# Patient Record
Sex: Female | Born: 1940 | Race: Black or African American | Hispanic: No | Marital: Married | Smoking: Former smoker
Health system: Southern US, Community
[De-identification: ages and names within clinical notes are randomized; demographics above are authoritative.]

## PROBLEM LIST (undated history)

## (undated) DIAGNOSIS — R42 Dizziness and giddiness: Secondary | ICD-10-CM

## (undated) DIAGNOSIS — H8109 Meniere's disease, unspecified ear: Secondary | ICD-10-CM

## (undated) HISTORY — PX: TONSILLECTOMY: SUR1361

## (undated) HISTORY — PX: CHOLECYSTECTOMY: SHX55

## (undated) HISTORY — PX: FRACTURE SURGERY: SHX138

---

## 1998-02-19 ENCOUNTER — Ambulatory Visit (HOSPITAL_COMMUNITY): Admission: RE | Admit: 1998-02-19 | Discharge: 1998-02-19 | Payer: Self-pay | Admitting: Obstetrics and Gynecology

## 1998-05-03 ENCOUNTER — Encounter: Payer: Self-pay | Admitting: Emergency Medicine

## 1998-05-03 ENCOUNTER — Emergency Department (HOSPITAL_COMMUNITY): Admission: EM | Admit: 1998-05-03 | Discharge: 1998-05-03 | Payer: Self-pay | Admitting: Emergency Medicine

## 1999-02-19 ENCOUNTER — Other Ambulatory Visit: Admission: RE | Admit: 1999-02-19 | Discharge: 1999-02-19 | Payer: Self-pay | Admitting: Obstetrics and Gynecology

## 1999-03-10 ENCOUNTER — Encounter (INDEPENDENT_AMBULATORY_CARE_PROVIDER_SITE_OTHER): Payer: Self-pay | Admitting: Specialist

## 1999-03-10 ENCOUNTER — Other Ambulatory Visit: Admission: RE | Admit: 1999-03-10 | Discharge: 1999-03-10 | Payer: Self-pay | Admitting: Obstetrics and Gynecology

## 2001-02-25 ENCOUNTER — Other Ambulatory Visit: Admission: RE | Admit: 2001-02-25 | Discharge: 2001-02-25 | Payer: Self-pay | Admitting: Obstetrics and Gynecology

## 2001-03-17 ENCOUNTER — Encounter: Payer: Self-pay | Admitting: Obstetrics and Gynecology

## 2001-03-17 ENCOUNTER — Encounter: Admission: RE | Admit: 2001-03-17 | Discharge: 2001-03-17 | Payer: Self-pay | Admitting: Obstetrics and Gynecology

## 2001-12-08 ENCOUNTER — Encounter: Admission: RE | Admit: 2001-12-08 | Discharge: 2001-12-08 | Payer: Self-pay | Admitting: Gastroenterology

## 2001-12-08 ENCOUNTER — Encounter: Payer: Self-pay | Admitting: Gastroenterology

## 2001-12-16 ENCOUNTER — Encounter: Admission: RE | Admit: 2001-12-16 | Discharge: 2001-12-16 | Payer: Self-pay | Admitting: Gastroenterology

## 2001-12-16 ENCOUNTER — Encounter: Payer: Self-pay | Admitting: Gastroenterology

## 2003-08-08 ENCOUNTER — Encounter: Admission: RE | Admit: 2003-08-08 | Discharge: 2003-08-08 | Payer: Self-pay | Admitting: Gastroenterology

## 2004-01-03 ENCOUNTER — Other Ambulatory Visit: Admission: RE | Admit: 2004-01-03 | Discharge: 2004-01-03 | Payer: Self-pay | Admitting: Gastroenterology

## 2004-08-27 ENCOUNTER — Encounter: Admission: RE | Admit: 2004-08-27 | Discharge: 2004-08-27 | Payer: Self-pay | Admitting: Gastroenterology

## 2005-01-07 ENCOUNTER — Other Ambulatory Visit: Admission: RE | Admit: 2005-01-07 | Discharge: 2005-01-07 | Payer: Self-pay | Admitting: Gastroenterology

## 2005-08-03 ENCOUNTER — Ambulatory Visit: Admission: RE | Admit: 2005-08-03 | Discharge: 2005-08-03 | Payer: Self-pay | Admitting: *Deleted

## 2006-01-18 ENCOUNTER — Other Ambulatory Visit: Admission: RE | Admit: 2006-01-18 | Discharge: 2006-01-18 | Payer: Self-pay | Admitting: Gastroenterology

## 2006-02-09 ENCOUNTER — Encounter: Admission: RE | Admit: 2006-02-09 | Discharge: 2006-02-09 | Payer: Self-pay | Admitting: Gastroenterology

## 2006-04-28 ENCOUNTER — Encounter: Admission: RE | Admit: 2006-04-28 | Discharge: 2006-04-28 | Payer: Self-pay | Admitting: Gastroenterology

## 2006-05-21 ENCOUNTER — Encounter (INDEPENDENT_AMBULATORY_CARE_PROVIDER_SITE_OTHER): Payer: Self-pay | Admitting: Specialist

## 2006-05-21 ENCOUNTER — Ambulatory Visit (HOSPITAL_COMMUNITY): Admission: RE | Admit: 2006-05-21 | Discharge: 2006-05-21 | Payer: Self-pay | Admitting: General Surgery

## 2007-01-27 ENCOUNTER — Other Ambulatory Visit: Admission: RE | Admit: 2007-01-27 | Discharge: 2007-01-27 | Payer: Self-pay | Admitting: Gastroenterology

## 2007-02-21 ENCOUNTER — Encounter: Admission: RE | Admit: 2007-02-21 | Discharge: 2007-02-21 | Payer: Self-pay | Admitting: Gastroenterology

## 2007-10-14 ENCOUNTER — Encounter: Admission: RE | Admit: 2007-10-14 | Discharge: 2007-10-14 | Payer: Self-pay | Admitting: Gastroenterology

## 2009-02-12 ENCOUNTER — Encounter: Admission: RE | Admit: 2009-02-12 | Discharge: 2009-02-12 | Payer: Self-pay | Admitting: Internal Medicine

## 2009-02-14 ENCOUNTER — Encounter: Admission: RE | Admit: 2009-02-14 | Discharge: 2009-02-14 | Payer: Self-pay | Admitting: Internal Medicine

## 2009-08-05 ENCOUNTER — Encounter: Admission: RE | Admit: 2009-08-05 | Discharge: 2009-08-05 | Payer: Self-pay | Admitting: Internal Medicine

## 2010-02-11 ENCOUNTER — Encounter: Admission: RE | Admit: 2010-02-11 | Discharge: 2010-02-11 | Payer: Self-pay | Admitting: Internal Medicine

## 2010-11-14 NOTE — Op Note (Signed)
NAME:  Martha Lara, WIETING NO.:  [id number]   MEDICAL RECORD NO.:  [id number]          PATIENT TYPE:  AMB   LOCATION:  DAY                          FACILITY:  [facility]   PHYSICIAN:  Leonie Man, M.D.   DATE OF BIRTH:  February 24, 1941   DATE OF PROCEDURE:  DATE OF DISCHARGE:                               OPERATIVE REPORT   PREOPERATIVE DIAGNOSIS:  Chronic calculous; cholecystitis.   POSTOPERATIVE DIAGNOSIS:  Chronic calculous; cholecystitis.   PROCEDURE:  Laparoscopic cholecystectomy with intraoperative  cholangiogram.   SURGEON:  Leonie Man, M.D.   ASSISTANT:  Dr. Jerelene Redden.   ANESTHESIA:  General.   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  This has been associated with intermittent nausea but  without vomiting.  She underwent ultrasound evaluation which shows  cholelithiasis.  Her liver functions are within normal limits.  She  comes to the operating room now after the risks and potential benefits  of surgery have been discussed, all questions answered and consent  obtained.Marland Kitchen   PROCEDURE:  The patient was positioned supinely and the abdomen was  prepped and draped to be included in a sterile operative field after  adequate general anesthesia.  Open laparoscopy was created at the  umbilicus with insertion of a Hassan cannula and insufflation peritoneal  cavity to 14 mmHg pressure.  Exploration of the abdomen showed a normal  anterior gastric wall.  Duodenal sweep, the liver edges were sharp,  liver surface smooth.  The gallbladder appeared to be chronically  scarred but without any significant number of adhesions to it.  The  remainder of the abdomen, small and large intestine visualized, did not  appear to be abnormal.   Under direct vision, epigastric and lateral ports were placed.  The  gallbladder was grasped and retracted  cephalad.  Dissection carried down  to the region of the hepatic duodenal ligament and ampulla of the  gallbladder isolating the cystic artery and the cystic duct.  The cystic  artery was doubly clipped and transected.  The cystic duct was clipped  proximally and opened.  The cystic duct cholangiogram was carried out by  passing a Cook catheter into the abdomen and into the cystic duct.  Through this a one half-strength Hypaque solution was injected under  fluoroscopic control and the resultant cholangiogram shows prompt flow  of contrast into the duodenum.  There are no filling defects in the  extrahepatic ductal system.  Cholangiocatheter was withdrawn and the  cystic duct was triply clipped and transected.  The gallbladder was then  dissected free from the liver bed using electrocautery and maintaining  hemostasis throughout the entire course of the dissection.  At end of  the dissection, the bed was inspected.  There were no bleeding points.  The right upper quadrant was thoroughly irrigated with normal saline and  aspirated.  The gallbladder was retrieved through the umbilical port  without difficulty.  Sponge and instrument counts were verified, trocars  removed  under direct vision and the pneumoperitoneum allowed to deflate.  The wounds closed in layers, #0 Dexon and 4-0 Monocryl in the umbilicus,  4-0 Monocryl in the epigastric and flank  wounds.  The wounds were then reinforced with Steri-Strips.  Sterile  dressings applied.  The anesthetic reversed.  The patient removed from  the operating room to the recovery room in stable condition.  She  tolerated the procedure well.      Leonie Man, M.D.  Electronically Signed     PB/MEDQ  D:  05/21/2006  T:  05/21/2006  Job:  [id number]   cc:   Tasia Catchings, M.D.  Fax: [phone]

## 2011-01-27 ENCOUNTER — Other Ambulatory Visit: Payer: Self-pay | Admitting: Internal Medicine

## 2011-01-27 DIAGNOSIS — Z1231 Encounter for screening mammogram for malignant neoplasm of breast: Secondary | ICD-10-CM

## 2011-02-13 ENCOUNTER — Ambulatory Visit
Admission: RE | Admit: 2011-02-13 | Discharge: 2011-02-13 | Disposition: A | Discharge status: Home / Self Care | Payer: BC Managed Care – PPO | Source: Ambulatory Visit | Attending: Internal Medicine | Admitting: Internal Medicine

## 2011-02-13 DIAGNOSIS — Z1231 Encounter for screening mammogram for malignant neoplasm of breast: Secondary | ICD-10-CM

## 2012-02-22 ENCOUNTER — Other Ambulatory Visit: Payer: Self-pay | Admitting: Internal Medicine

## 2012-02-22 DIAGNOSIS — Z1231 Encounter for screening mammogram for malignant neoplasm of breast: Secondary | ICD-10-CM

## 2012-03-18 ENCOUNTER — Ambulatory Visit: Payer: BC Managed Care – PPO

## 2012-05-24 ENCOUNTER — Other Ambulatory Visit: Payer: Self-pay | Admitting: Gastroenterology

## 2012-06-13 ENCOUNTER — Ambulatory Visit
Admission: RE | Admit: 2012-06-13 | Discharge: 2012-06-13 | Disposition: A | Discharge status: Home / Self Care | Payer: BC Managed Care – PPO | Source: Ambulatory Visit | Attending: Internal Medicine | Admitting: Internal Medicine

## 2012-06-13 DIAGNOSIS — Z1231 Encounter for screening mammogram for malignant neoplasm of breast: Secondary | ICD-10-CM

## 2012-06-17 ENCOUNTER — Other Ambulatory Visit: Payer: Self-pay | Admitting: Internal Medicine

## 2012-06-17 DIAGNOSIS — R928 Other abnormal and inconclusive findings on diagnostic imaging of breast: Secondary | ICD-10-CM

## 2012-07-01 ENCOUNTER — Ambulatory Visit
Admission: RE | Admit: 2012-07-01 | Discharge: 2012-07-01 | Disposition: A | Discharge status: Home / Self Care | Payer: Medicare PPO | Source: Ambulatory Visit | Attending: Internal Medicine | Admitting: Internal Medicine

## 2012-07-01 DIAGNOSIS — R928 Other abnormal and inconclusive findings on diagnostic imaging of breast: Secondary | ICD-10-CM

## 2012-09-08 ENCOUNTER — Ambulatory Visit (INDEPENDENT_AMBULATORY_CARE_PROVIDER_SITE_OTHER): Payer: Medicare PPO | Admitting: Family Medicine

## 2012-09-08 VITALS — BP 138/84 | HR 87 | Temp 98.1°F | Resp 16 | Ht 64.5 in | Wt 210.0 lb

## 2012-09-08 DIAGNOSIS — J309 Allergic rhinitis, unspecified: Secondary | ICD-10-CM

## 2012-09-08 DIAGNOSIS — J019 Acute sinusitis, unspecified: Secondary | ICD-10-CM

## 2012-09-08 MED ORDER — AMOXICILLIN-POT CLAVULANATE 875-125 MG PO TABS: 1.0000 | ORAL_TABLET | Freq: Two times a day (BID) | ORAL | Status: DC

## 2012-09-08 NOTE — Patient Instructions (Signed)

## 2012-09-08 NOTE — Progress Notes (Signed)
  [facility]:  Office Visit  Chief Complaint:  Chief Complaint  Patient presents with  . URI    x 2 weeks    HPI: Martha Lara is a 72 y.o. female who complains of URI sxs x 2 weeks , tried nasonex and claritin without relief , h/o allergies Facial pain. HA all last night. Barely any cough. Just a touch of ringing in ears. No fevers, chills.  Son and grandkids have had colds. + allergies. Never gets flu vaccine.   History reviewed. No pertinent past medical history. Past Surgical History  Procedure Laterality Date  . Cholecystectomy    . Tonsillectomy     History   Social History  . Marital Status: Married    Spouse Name: N/A    Number of Children: N/A  . Years of Education: N/A   Social History Main Topics  . Smoking status: Former Games developer  . Smokeless tobacco: Never Used  . Alcohol Use: No  . Drug Use: No  . Sexually Active: None   Other Topics Concern  . None   Social History Narrative  . None   Family History  Problem Relation Age of Onset  . Cancer Father    No Known Allergies Prior to Admission medications   Medication Sig Start Date End Date Taking? Authorizing Provider  loratadine (CLARITIN) 10 MG tablet Take 10 mg by mouth daily.   Yes Historical Provider, MD  mometasone (NASONEX) 50 MCG/ACT nasal spray Place 2 sprays into the nose daily.   Yes Historical Provider, MD     ROS: The patient denies night sweats, unintentional weight loss, chest pain, palpitations, wheezing, dyspnea on exertion, nausea, vomiting, abdominal pain, dysuria, hematuria, melena, numbness, weakness, or tingling.  All other systems have been reviewed and were otherwise negative with the exception of those mentioned in the HPI and as above.    PHYSICAL EXAM: Filed Vitals:   09/08/12 1139  BP: 138/84  Pulse: 87  Temp: 98.1 F (36.7 C)  Resp: 16  Spo 2  99% Filed Vitals:   09/08/12 1139  Height: 5' 4.5" (1.638 m)  Weight: 210 lb (95.255 kg)   Body  mass index is 35.5 kg/(m^2).  General: Alert, no acute distress HEENT:  Normocephalic, atraumatic, oropharynx patent.  + sinus tenerness bil. TM normal. No exudtes.  Cardiovascular:  Regular rate and rhythm, no rubs murmurs or gallops.  No Carotid bruits, radial pulse intact. No pedal edema.  Respiratory: Clear to auscultation bilaterally.  No wheezes, rales, or rhonchi.  No cyanosis, no use of accessory musculature GI: No organomegaly, abdomen is soft and non-tender, positive bowel sounds.  No masses. Skin: No rashes. Neurologic: Facial musculature symmetric. Psychiatric: Patient is appropriate throughout our interaction. Lymphatic: No cervical lymphadenopathy Musculoskeletal: Gait intact.   LABS: No results found for this or any previous visit.   EKG/XRAY:   Primary read interpreted by Dr. Conley Rolls at [facility].   ASSESSMENT/PLAN: Encounter Diagnoses  Name Primary?  . Acute sinusitis Yes  . Allergic rhinitis    Rx Augmentin C/w nasonex and claritin F/u prn     Damien Batty PHUONG, DO 09/08/2012 1:07 PM

## 2013-03-10 ENCOUNTER — Other Ambulatory Visit: Payer: Self-pay | Admitting: Orthopedic Surgery

## 2013-03-10 DIAGNOSIS — M25512 Pain in left shoulder: Secondary | ICD-10-CM

## 2013-03-21 ENCOUNTER — Ambulatory Visit
Admission: RE | Admit: 2013-03-21 | Discharge: 2013-03-21 | Disposition: A | Discharge status: Home / Self Care | Payer: Medicare PPO | Source: Ambulatory Visit | Attending: Orthopedic Surgery | Admitting: Orthopedic Surgery

## 2013-03-21 DIAGNOSIS — M25512 Pain in left shoulder: Secondary | ICD-10-CM

## 2013-03-21 MED ORDER — IOHEXOL 180 MG/ML  SOLN
15.0000 mL | Freq: Once | INTRAMUSCULAR | Status: AC | PRN
  Administered 2013-03-21: 15 mL via INTRA_ARTICULAR

## 2013-08-24 ENCOUNTER — Other Ambulatory Visit: Payer: Self-pay

## 2013-08-24 DIAGNOSIS — Z1231 Encounter for screening mammogram for malignant neoplasm of breast: Secondary | ICD-10-CM

## 2013-09-08 ENCOUNTER — Ambulatory Visit
Admission: RE | Admit: 2013-09-08 | Discharge: 2013-09-08 | Disposition: A | Discharge status: Home / Self Care | Payer: Medicare PPO | Source: Ambulatory Visit

## 2013-09-08 DIAGNOSIS — Z1231 Encounter for screening mammogram for malignant neoplasm of breast: Secondary | ICD-10-CM

## 2013-09-21 ENCOUNTER — Other Ambulatory Visit (HOSPITAL_COMMUNITY)
Admission: RE | Admit: 2013-09-21 | Discharge: 2013-09-21 | Disposition: A | Discharge status: Home / Self Care | Payer: Medicare PPO | Source: Ambulatory Visit | Attending: Obstetrics & Gynecology | Admitting: Obstetrics & Gynecology

## 2013-09-21 ENCOUNTER — Other Ambulatory Visit: Payer: Self-pay | Admitting: Obstetrics & Gynecology

## 2013-09-21 DIAGNOSIS — Z01419 Encounter for gynecological examination (general) (routine) without abnormal findings: Secondary | ICD-10-CM | POA: Insufficient documentation

## 2013-09-21 DIAGNOSIS — Z1151 Encounter for screening for human papillomavirus (HPV): Secondary | ICD-10-CM | POA: Insufficient documentation

## 2013-11-05 ENCOUNTER — Emergency Department (HOSPITAL_COMMUNITY)
Admission: EM | Admit: 2013-11-05 | Discharge: 2013-11-05 | Disposition: A | Discharge status: Home / Self Care | Payer: Medicare PPO | Attending: Emergency Medicine | Admitting: Emergency Medicine

## 2013-11-05 DIAGNOSIS — E860 Dehydration: Secondary | ICD-10-CM | POA: Insufficient documentation

## 2013-11-05 DIAGNOSIS — Z87891 Personal history of nicotine dependence: Secondary | ICD-10-CM | POA: Insufficient documentation

## 2013-11-05 DIAGNOSIS — R197 Diarrhea, unspecified: Secondary | ICD-10-CM | POA: Insufficient documentation

## 2013-11-05 DIAGNOSIS — R42 Dizziness and giddiness: Secondary | ICD-10-CM

## 2013-11-05 DIAGNOSIS — N390 Urinary tract infection, site not specified: Secondary | ICD-10-CM

## 2013-11-05 DIAGNOSIS — H9190 Unspecified hearing loss, unspecified ear: Secondary | ICD-10-CM | POA: Insufficient documentation

## 2013-11-05 DIAGNOSIS — Z8709 Personal history of other diseases of the respiratory system: Secondary | ICD-10-CM | POA: Insufficient documentation

## 2013-11-05 DIAGNOSIS — H5500 Unspecified nystagmus: Secondary | ICD-10-CM | POA: Insufficient documentation

## 2013-11-05 DIAGNOSIS — Z79899 Other long term (current) drug therapy: Secondary | ICD-10-CM | POA: Insufficient documentation

## 2013-11-05 LAB — URINALYSIS, ROUTINE W REFLEX MICROSCOPIC
Bilirubin Urine: NEGATIVE
GLUCOSE, UA: NEGATIVE mg/dL
Hgb urine dipstick: NEGATIVE
Ketones, ur: NEGATIVE mg/dL
NITRITE: NEGATIVE
Protein, ur: NEGATIVE mg/dL
Specific Gravity, Urine: 1.022 (ref 1.005–1.030)
Urobilinogen, UA: 0.2 mg/dL (ref 0.0–1.0)
pH: 5 (ref 5.0–8.0)

## 2013-11-05 LAB — CBC WITH DIFFERENTIAL/PLATELET
BASOS ABS: 0 10*3/uL (ref 0.0–0.1)
Basophils Relative: 0 % (ref 0–1)
Eosinophils Absolute: 0 10*3/uL (ref 0.0–0.7)
Eosinophils Relative: 1 % (ref 0–5)
HCT: 40.7 % (ref 36.0–46.0)
Hemoglobin: 13.6 g/dL (ref 12.0–15.0)
LYMPHS ABS: 1 10*3/uL (ref 0.7–4.0)
Lymphocytes Relative: 21 % (ref 12–46)
MCH: 31.7 pg (ref 26.0–34.0)
MCHC: 33.4 g/dL (ref 30.0–36.0)
MCV: 94.9 fL (ref 78.0–100.0)
Monocytes Absolute: 0.3 10*3/uL (ref 0.1–1.0)
Monocytes Relative: 7 % (ref 3–12)
NEUTROS ABS: 3.3 10*3/uL (ref 1.7–7.7)
Neutrophils Relative %: 71 % (ref 43–77)
Platelets: 257 10*3/uL (ref 150–400)
RBC: 4.29 MIL/uL (ref 3.87–5.11)
RDW: 12.7 % (ref 11.5–15.5)
WBC: 4.7 10*3/uL (ref 4.0–10.5)

## 2013-11-05 LAB — BASIC METABOLIC PANEL
BUN: 12 mg/dL (ref 6–23)
CHLORIDE: 104 meq/L (ref 96–112)
CO2: 23 meq/L (ref 19–32)
Calcium: 9.3 mg/dL (ref 8.4–10.5)
Creatinine, Ser: 0.84 mg/dL (ref 0.50–1.10)
GFR calc non Af Amer: 68 mL/min — ABNORMAL LOW (ref >90)
GFR, EST AFRICAN AMERICAN: 79 mL/min — AB (ref >90)
Glucose, Bld: 149 mg/dL — ABNORMAL HIGH (ref 70–99)
Potassium: 3.6 mEq/L — ABNORMAL LOW (ref 3.7–5.3)
SODIUM: 139 meq/L (ref 137–147)

## 2013-11-05 LAB — URINE MICROSCOPIC-ADD ON

## 2013-11-05 MED ORDER — DEXTROSE 5 % IV SOLN
1.0000 g | Freq: Once | INTRAVENOUS | Status: AC
  Administered 2013-11-05: 1 g via INTRAVENOUS
  Filled 2013-11-05: qty 10

## 2013-11-05 MED ORDER — LORAZEPAM 2 MG/ML IJ SOLN
0.5000 mg | Freq: Once | INTRAMUSCULAR | Status: AC
  Administered 2013-11-05: 0.5 mg via INTRAVENOUS
  Filled 2013-11-05: qty 1

## 2013-11-05 MED ORDER — SODIUM CHLORIDE 0.9 % IV BOLUS (SEPSIS)
1000.0000 mL | Freq: Once | INTRAVENOUS | Status: AC
  Administered 2013-11-05: 1000 mL via INTRAVENOUS

## 2013-11-05 MED ORDER — LORAZEPAM 1 MG PO TABS: 1.0000 mg | ORAL_TABLET | Freq: Three times a day (TID) | ORAL | Status: DC | PRN

## 2013-11-05 MED ORDER — CEPHALEXIN 500 MG PO CAPS: 500.0000 mg | ORAL_CAPSULE | Freq: Two times a day (BID) | ORAL | Status: DC

## 2013-11-05 NOTE — ED Provider Notes (Signed)
CSN: [id number]     Arrival date & time 11/05/13  1202 History   First MD Initiated Contact with Patient 11/05/13 1207     No chief complaint on file.    (Consider location/radiation/quality/duration/timing/severity/associated sxs/prior Treatment) HPI  Pt with hx vertigo, meniere's disease presents with worsening dizziness since April 15.  States the past two days she has had longer and more intense episodes than usual. She has dizziness, defined as room spinning, right ear hearing distorted.  Symptoms are exacerbated with moving eyes or head from side to side. When she has an "attack" like this she also gets lower abdominal cramping and loose stool - the abdominal discomfort resolves after she has a bowel movement.  She took meclizine this morning but was unable to keep it down.  Has also previously tried valium, prescribed by ENT, without improvement.  Denies fevers, headache or pain anywhere, diplopia, weakness or numbness of the extremities.   Pt treated recently for sinus infection with antibiotics and steroids, finished one week ago.    No past medical history on file. Past Surgical History  Procedure Laterality Date  . Cholecystectomy    . Tonsillectomy     Family History  Problem Relation Age of Onset  . Cancer Father    History  Substance Use Topics  . Smoking status: Former Research scientist (life sciences)  . Smokeless tobacco: Never Used  . Alcohol Use: No   OB History   Grav Para Term Preterm Abortions TAB SAB Ect Mult Living                 Review of Systems  Constitutional: Negative for fever.  HENT: Positive for hearing loss. Negative for ear discharge and ear pain.   Respiratory: Negative for cough and shortness of breath.   Cardiovascular: Negative for chest pain.  Gastrointestinal: Negative for nausea, vomiting, abdominal pain and diarrhea.  Genitourinary: Negative for dysuria, urgency and frequency.  Neurological: Positive for dizziness. Negative for weakness, light-headedness,  numbness and headaches.  All other systems reviewed and are negative.     Allergies  Review of patient's allergies indicates no known allergies.  Home Medications   Prior to Admission medications   Medication Sig Start Date End Date Taking? Authorizing Provider  loratadine (CLARITIN) 10 MG tablet Take 10 mg by mouth daily.   Yes Historical Provider, MD  meclizine (ANTIVERT) 12.5 MG tablet Take 12.5 mg by mouth once.   Yes Historical Provider, MD   BP 151/91  Pulse 74  Temp(Src) 97.5 F (36.4 C) (Oral)  Resp 16  SpO2 98% Physical Exam  Nursing note and vitals reviewed. Constitutional: She appears well-developed and well-nourished. No distress.  HENT:  Head: Normocephalic and atraumatic.  Mouth/Throat: Mucous membranes are dry. No posterior oropharyngeal edema or posterior oropharyngeal erythema.  Eyes: Conjunctivae and EOM are normal. Pupils are equal, round, and reactive to light. Right eye exhibits no discharge. Left eye exhibits no discharge. No scleral icterus.  Neck: Normal range of motion. Neck supple.  Cardiovascular: Normal rate and regular rhythm.   Pulmonary/Chest: Effort normal and breath sounds normal. No respiratory distress. She has no wheezes. She has no rales.  Abdominal: Soft. She exhibits no distension. There is no tenderness. There is no rebound and no guarding.  Neurological: She is alert.  CN II-XII intact, EOMs intact, no pronator drift, grip strengths equal bilaterally; strength 5/5 in all extremities, sensation intact in all extremities; finger to nose, heel to shin, rapid alternating movements normal.  Gait testing deferred  as patient is dizzy.    +horizontal nystagmus.  symptoms exacerbated with EOM testing and moving head left to right.   Skin: She is not diaphoretic.  Psychiatric: She has a normal mood and affect. Her behavior is normal.    ED Course  Procedures (including critical care time) Labs Review Labs Reviewed  URINALYSIS, ROUTINE W REFLEX  MICROSCOPIC - Abnormal; Notable for the following:    APPearance CLOUDY (*)    Leukocytes, UA SMALL (*)    All other components within normal limits  BASIC METABOLIC PANEL - Abnormal; Notable for the following:    Potassium 3.6 (*)    Glucose, Bld 149 (*)    GFR calc non Af Amer 68 (*)    GFR calc Af Amer 79 (*)    All other components within normal limits  URINE MICROSCOPIC-ADD ON - Abnormal; Notable for the following:    Squamous Epithelial / LPF FEW (*)    All other components within normal limits  URINE CULTURE  CBC WITH DIFFERENTIAL    Imaging Review No results found.   EKG Interpretation None      2:14 PM Pt reports she is feeling much better.  Dizziness has resolved.  No nystagmus presently.  She is able to move head left to right and perform EOM testing without symptoms.   Pt discussed with Dr Zenia Resides who has also seen the patient.    MDM   Final diagnoses:  Vertigo  UTI (lower urinary tract infection)  Mild dehydration    Pt with hx meniere's disease/vertigo with worsening symptoms over the past 3 weeks, uncontrolled with meclizine and valium at home.  Pt found to be dehydrated with UTI.  Vertiginous symptoms improved with IVF and ativan.  D/C home with abx for UTI, also ativan for dizziness PRN.  Discussed results, findings, treatment, and follow up  with patient.  Pt given return precautions.  Pt verbalizes understanding and agrees with plan.        Clayton Bibles, PA-C 11/05/13 1456

## 2013-11-05 NOTE — ED Provider Notes (Signed)
Medical screening examination/treatment/procedure(s) were conducted as a shared visit with non-physician practitioner(s) and myself.  I personally evaluated the patient during the encounter.   EKG Interpretation None     Patient here complaining of dizziness with emesis similar to her history of having Mnire's disease. Feels better after meds. No focal neurological deficits that she is stable for discharge.  Leota Jacobsen, MD 11/05/13 [phone]

## 2013-11-05 NOTE — ED Notes (Signed)
Bed: WA20 Expected date:  Expected time:  Means of arrival:  Comments: HOLD for triage

## 2013-11-05 NOTE — Discharge Instructions (Signed)
Read the information below.  Use the prescribed medication as directed.  Please discuss all new medications with your pharmacist.  You may return to the Emergency Department at any time for worsening condition or any new symptoms that concern you.    Your exam shows you have had an episode of vertigo, which causes a false sense of movement such as a spinning feeling or walls that seem to move.  Most vertigo is caused by a (usually temporary) problem in the inner ear. Rarely, the back part of the brain can cause vertigo (some mini-strokes / TIA's / strokes), but it appears to be a low risk cause for you at this time. It is important to follow-up with your doctor however, to see if you need further testing.  Do not drive or participate in potentially dangerous activities requiring balance unless off meds (not drowsy) and the vertigo has resolved. Most of the time benign vertigo is much better after a few days. However, mild unsteadiness may last for up to 3 months in some patients. An MRI scan or other special tests to evaluate your hearing and balance may be needed if the vertigo does not improve or returns in the future. RETURN IMMEDIATELY IF YOU HAVE ANY OF THE FOLLOWING (call 911): Increasing vertigo, earache, ear drainage, or loss of hearing.  Severe headache, blurred or double vision, or trouble walking.  Fainting or poorly responsive, extreme weakness, chest pain, or palpitations.  Fever, persistent vomiting, or dehydration.  Numbness, tingling, incoordination, or weakness of the limbs.  Change in speech, vision, swallowing, understanding, or other concerns.   Dehydration, Adult Dehydration is when you lose more fluids from the body than you take in. Vital organs like the kidneys, brain, and heart cannot function without a proper amount of fluids and salt. Any loss of fluids from the body can cause dehydration.  CAUSES   Vomiting.  Diarrhea.  Excessive sweating.  Excessive urine  output.  Fever. SYMPTOMS  Mild dehydration  Thirst.  Dry lips.  Slightly dry mouth. Moderate dehydration  Very dry mouth.  Sunken eyes.  Skin does not bounce back quickly when lightly pinched and released.  Dark urine and decreased urine production.  Decreased tear production.  Headache. Severe dehydration  Very dry mouth.  Extreme thirst.  Rapid, weak pulse (more than 100 beats per minute at rest).  Cold hands and feet.  Not able to sweat in spite of heat and temperature.  Rapid breathing.  Blue lips.  Confusion and lethargy.  Difficulty being awakened.  Minimal urine production.  No tears. DIAGNOSIS  Your caregiver will diagnose dehydration based on your symptoms and your exam. Blood and urine tests will help confirm the diagnosis. The diagnostic evaluation should also identify the cause of dehydration. TREATMENT  Treatment of mild or moderate dehydration can often be done at home by increasing the amount of fluids that you drink. It is best to drink small amounts of fluid more often. Drinking too much at one time can make vomiting worse. Refer to the home care instructions below. Severe dehydration needs to be treated at the hospital where you will probably be given intravenous (IV) fluids that contain water and electrolytes. HOME CARE INSTRUCTIONS   Ask your caregiver about specific rehydration instructions.  Drink enough fluids to keep your urine clear or pale yellow.  Drink small amounts frequently if you have nausea and vomiting.  Eat as you normally do.  Avoid:  Foods or drinks high in sugar.  Carbonated  drinks.  Juice.  Extremely hot or cold fluids.  Drinks with caffeine.  Fatty, greasy foods.  Alcohol.  Tobacco.  Overeating.  Gelatin desserts.  Wash your hands well to avoid spreading bacteria and viruses.  Only take over-the-counter or prescription medicines for pain, discomfort, or fever as directed by your  caregiver.  Ask your caregiver if you should continue all prescribed and over-the-counter medicines.  Keep all follow-up appointments with your caregiver. SEEK MEDICAL CARE IF:  You have abdominal pain and it increases or stays in one area (localizes).  You have a rash, stiff neck, or severe headache.  You are irritable, sleepy, or difficult to awaken.  You are weak, dizzy, or extremely thirsty. SEEK IMMEDIATE MEDICAL CARE IF:   You are unable to keep fluids down or you get worse despite treatment.  You have frequent episodes of vomiting or diarrhea.  You have blood or green matter (bile) in your vomit.  You have blood in your stool or your stool looks black and tarry.  You have not urinated in 6 to 8 hours, or you have only urinated a small amount of very dark urine.  You have a fever.  You faint. MAKE SURE YOU:   Understand these instructions.  Will watch your condition.  Will get help right away if you are not doing well or get worse. Document Released: 06/15/2005 Document Revised: 09/07/2011 Document Reviewed: 02/02/2011 [facility] Patient Information 2014 [place], [place].  Urinary Tract Infection Urinary tract infections (UTIs) can develop anywhere along your urinary tract. Your urinary tract is your body's drainage system for removing wastes and extra water. Your urinary tract includes two kidneys, two ureters, a bladder, and a urethra. Your kidneys are a pair of bean-shaped organs. Each kidney is about the size of your fist. They are located below your ribs, one on each side of your spine. CAUSES Infections are caused by microbes, which are microscopic organisms, including fungi, viruses, and bacteria. These organisms are so small that they can only be seen through a microscope. Bacteria are the microbes that most commonly cause UTIs. SYMPTOMS  Symptoms of UTIs may vary by age and gender of the patient and by the location of the infection. Symptoms in young women  typically include a frequent and intense urge to urinate and a painful, burning feeling in the bladder or urethra during urination. Older women and men are more likely to be tired, shaky, and weak and have muscle aches and abdominal pain. A fever may mean the infection is in your kidneys. Other symptoms of a kidney infection include pain in your back or sides below the ribs, nausea, and vomiting. DIAGNOSIS To diagnose a UTI, your caregiver will ask you about your symptoms. Your caregiver also will ask to provide a urine sample. The urine sample will be tested for bacteria and white blood cells. White blood cells are made by your body to help fight infection. TREATMENT  Typically, UTIs can be treated with medication. Because most UTIs are caused by a bacterial infection, they usually can be treated with the use of antibiotics. The choice of antibiotic and length of treatment depend on your symptoms and the type of bacteria causing your infection. HOME CARE INSTRUCTIONS  If you were prescribed antibiotics, take them exactly as your caregiver instructs you. Finish the medication even if you feel better after you have only taken some of the medication.  Drink enough water and fluids to keep your urine clear or pale yellow.  Avoid caffeine,  tea, and carbonated beverages. They tend to irritate your bladder.  Empty your bladder often. Avoid holding urine for long periods of time.  Empty your bladder before and after sexual intercourse.  After a bowel movement, women should cleanse from front to back. Use each tissue only once. SEEK MEDICAL CARE IF:   You have back pain.  You develop a fever.  Your symptoms do not begin to resolve within 3 days. SEEK IMMEDIATE MEDICAL CARE IF:   You have severe back pain or lower abdominal pain.  You develop chills.  You have nausea or vomiting.  You have continued burning or discomfort with urination. MAKE SURE YOU:   Understand these  instructions.  Will watch your condition.  Will get help right away if you are not doing well or get worse. Document Released: 03/25/2005 Document Revised: 12/15/2011 Document Reviewed: 07/24/2011 [facility] Patient Information 2014 [place].

## 2013-11-05 NOTE — ED Notes (Signed)
Pt c/o nausea, vomiting and diarrhea intermittently since April 15th, 2015

## 2013-11-06 LAB — URINE CULTURE

## 2013-11-13 NOTE — ED Provider Notes (Signed)
Medical screening examination/treatment/procedure(s) were performed by non-physician practitioner and as supervising physician I was immediately available for consultation/collaboration.  Leota Jacobsen, MD 11/13/13 [phone]

## 2013-12-05 ENCOUNTER — Emergency Department (HOSPITAL_COMMUNITY)
Admission: EM | Admit: 2013-12-05 | Discharge: 2013-12-05 | Disposition: A | Discharge status: Home / Self Care | Payer: No Typology Code available for payment source | Attending: Emergency Medicine | Admitting: Emergency Medicine

## 2013-12-05 ENCOUNTER — Emergency Department (HOSPITAL_COMMUNITY): Payer: No Typology Code available for payment source

## 2013-12-05 ENCOUNTER — Encounter (HOSPITAL_COMMUNITY): Payer: Self-pay | Admitting: Emergency Medicine

## 2013-12-05 DIAGNOSIS — S0990XA Unspecified injury of head, initial encounter: Secondary | ICD-10-CM | POA: Insufficient documentation

## 2013-12-05 DIAGNOSIS — S46909A Unspecified injury of unspecified muscle, fascia and tendon at shoulder and upper arm level, unspecified arm, initial encounter: Secondary | ICD-10-CM | POA: Diagnosis not present

## 2013-12-05 DIAGNOSIS — Z79899 Other long term (current) drug therapy: Secondary | ICD-10-CM | POA: Insufficient documentation

## 2013-12-05 DIAGNOSIS — Z792 Long term (current) use of antibiotics: Secondary | ICD-10-CM | POA: Diagnosis not present

## 2013-12-05 DIAGNOSIS — R51 Headache: Secondary | ICD-10-CM

## 2013-12-05 DIAGNOSIS — Y92410 Unspecified street and highway as the place of occurrence of the external cause: Secondary | ICD-10-CM | POA: Insufficient documentation

## 2013-12-05 DIAGNOSIS — S4980XA Other specified injuries of shoulder and upper arm, unspecified arm, initial encounter: Secondary | ICD-10-CM | POA: Diagnosis not present

## 2013-12-05 DIAGNOSIS — S99929A Unspecified injury of unspecified foot, initial encounter: Secondary | ICD-10-CM

## 2013-12-05 DIAGNOSIS — Z87891 Personal history of nicotine dependence: Secondary | ICD-10-CM | POA: Diagnosis not present

## 2013-12-05 DIAGNOSIS — S8990XA Unspecified injury of unspecified lower leg, initial encounter: Secondary | ICD-10-CM | POA: Diagnosis not present

## 2013-12-05 DIAGNOSIS — S99919A Unspecified injury of unspecified ankle, initial encounter: Secondary | ICD-10-CM

## 2013-12-05 DIAGNOSIS — R519 Headache, unspecified: Secondary | ICD-10-CM

## 2013-12-05 DIAGNOSIS — M25512 Pain in left shoulder: Secondary | ICD-10-CM

## 2013-12-05 DIAGNOSIS — Y9389 Activity, other specified: Secondary | ICD-10-CM | POA: Insufficient documentation

## 2013-12-05 NOTE — ED Notes (Signed)
Pt c/o headache but denies hitting her head.
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
## 2014-04-19 ENCOUNTER — Ambulatory Visit (INDEPENDENT_AMBULATORY_CARE_PROVIDER_SITE_OTHER): Payer: Medicare PPO | Admitting: Sports Medicine

## 2014-04-19 VITALS — BP 118/78 | HR 85 | Temp 98.0°F | Resp 16 | Ht 64.0 in | Wt 207.5 lb

## 2014-04-19 DIAGNOSIS — B9789 Other viral agents as the cause of diseases classified elsewhere: Principal | ICD-10-CM

## 2014-04-19 DIAGNOSIS — J309 Allergic rhinitis, unspecified: Secondary | ICD-10-CM

## 2014-04-19 DIAGNOSIS — J069 Acute upper respiratory infection, unspecified: Secondary | ICD-10-CM

## 2014-04-19 MED ORDER — FLUTICASONE PROPIONATE 50 MCG/ACT NA SUSP: 2.0000 | Freq: Every day | NASAL | Status: DC

## 2014-04-19 NOTE — Progress Notes (Signed)
  Martha Lara - 73 y.o. female MRN [id number]  Date of birth: 1941/04/04  CC & HPI:  The patient presents for evaluation of: Chief Complaint  Patient presents with  . Cough    x 1 week  . Chest Congestion  . Nasal Congestion    Above symptoms: Patient reports 5-6 days of worsening nasal congestion, mildly productive cough with no associated dyspnea. She's had a history of allergic rhinitis and has been changed from [place] do to insurance no longer covering this. She's not been compliant with using nasal steroids on regular basis since this has happened. She denies any significant fevers, chills, facial pressure, and denies any changes in symptoms with positioning. Otherwise feeling well and does not have any abdominal pain, nausea vomiting, melena, hematochezia. She has not been trying to take any medications on a regular basis but has previously used antihistamines and nasal saline.  Occasional reflux sx with spicy foods, no voice changes.  Hearing unchanged with this illness  ROS:  Per HPI.   HISTORY: Past Medical, Surgical, Social, and Family History Reviewed & Updated per EMR.  Pertinent Historical Findings include: History of Mnire's disease, has bilateral hearing aids. History of seasonal allergies Nonsmoker   OBJECTIVE FINDINGS:  VS:   HT:5\' 4"  (162.6 cm)   WT:207 lb 8 oz (94.121 kg)  BMI:35.7         BP:118/78 mmHg  HR:85bpm  TEMP:98 F (36.7 C)(Oral)  RESP:99 %  PHYSICAL EXAM: GENERAL: elderly, spry African American female. In no discomfort; no respiratory distress hard of hearing with hearing aids in place .   PSYCH: alert and appropriate, good insight   HNEENT: mmm, no JVD, bilateral tympanic membranes are are pearly gray with a good cone of light, no significant effusion there does appear to be some sclerosis of the TM. No posterior oropharyngeal erythema. No tonsillar exudate. Scant cervical lymphadenopathy. No significant thyromegaly or thyroid nodules. Nasal  turbinates are erythematous and boggy with white exudate. ,   CARDIAC: RRR, S1/S2 heard, no murmur  LUNGS: CTA B, no wheezes, no crackles. Overall good air movement.   ABDOMEN:  soft, nontender no rebound or guarding   EXTREM: Warm, well perfused.  Moves all 4 extremities spontaneously; no lateralization.  Pedal pulses 1/4.  No  pretibial edema.   ASSESSMENT: 1. Viral URI with cough   2. Allergic rhinitis, unspecified allergic rhinitis type    ?underlying GERD but defer tx given acute illness  PLAN: See problem based charting & AVS for additional documentation. - Sx treatment for likely viral flare of underlying allergic rhinitis - Flonase Rx to see if covered by insurance.  - Reviewed redflags > Return if symptoms worsen or fail to improve.

## 2014-04-19 NOTE — Patient Instructions (Signed)
Take your Zyrtec & FLONASE Use the NETTY POT Okay to Take 2 Aleeve twice per day and Pseudofed for 5-6 days   You have viral infection that will resolve on its own over time.  Typically, symptoms can last for up to 10 days (or more) but should be continually improving after the 5th day.  If you exerpience worsening after the 7th day please call us back.  Please continue to drink plenty of fluids and remember you and everybody in your household need to wash their hands frequently!  Honey has been shown to help with cough.  You can try mixing  a teaspoon of honey in warm water or caffeine free herbal tea before bedtime. We don't know why, but chicken soup also helps, try it!

## 2014-04-27 ENCOUNTER — Encounter (HOSPITAL_COMMUNITY): Payer: Self-pay | Admitting: Emergency Medicine

## 2014-04-27 ENCOUNTER — Emergency Department (HOSPITAL_COMMUNITY)
Admission: EM | Admit: 2014-04-27 | Discharge: 2014-04-28 | Disposition: A | Discharge status: Home / Self Care | Payer: Medicare PPO | Attending: Emergency Medicine | Admitting: Emergency Medicine

## 2014-04-27 ENCOUNTER — Emergency Department (HOSPITAL_COMMUNITY): Payer: Medicare PPO

## 2014-04-27 DIAGNOSIS — R110 Nausea: Secondary | ICD-10-CM | POA: Insufficient documentation

## 2014-04-27 DIAGNOSIS — Z87891 Personal history of nicotine dependence: Secondary | ICD-10-CM | POA: Diagnosis not present

## 2014-04-27 DIAGNOSIS — R42 Dizziness and giddiness: Secondary | ICD-10-CM | POA: Diagnosis not present

## 2014-04-27 DIAGNOSIS — Z79899 Other long term (current) drug therapy: Secondary | ICD-10-CM | POA: Insufficient documentation

## 2014-04-27 DIAGNOSIS — R0789 Other chest pain: Secondary | ICD-10-CM | POA: Diagnosis not present

## 2014-04-27 DIAGNOSIS — R109 Unspecified abdominal pain: Secondary | ICD-10-CM | POA: Insufficient documentation

## 2014-04-27 DIAGNOSIS — R079 Chest pain, unspecified: Secondary | ICD-10-CM

## 2014-04-27 LAB — I-STAT TROPONIN, ED
TROPONIN I, POC: 0.01 ng/mL (ref 0.00–0.08)
Troponin i, poc: 0.01 ng/mL (ref 0.00–0.08)

## 2014-04-27 LAB — CBC WITH DIFFERENTIAL/PLATELET
BASOS PCT: 0 % (ref 0–1)
Basophils Absolute: 0 10*3/uL (ref 0.0–0.1)
Eosinophils Absolute: 0 10*3/uL (ref 0.0–0.7)
Eosinophils Relative: 0 % (ref 0–5)
HEMATOCRIT: 41 % (ref 36.0–46.0)
Hemoglobin: 13.7 g/dL (ref 12.0–15.0)
Lymphocytes Relative: 15 % (ref 12–46)
Lymphs Abs: 1 10*3/uL (ref 0.7–4.0)
MCH: 31.4 pg (ref 26.0–34.0)
MCHC: 33.4 g/dL (ref 30.0–36.0)
MCV: 94 fL (ref 78.0–100.0)
MONO ABS: 0.4 10*3/uL (ref 0.1–1.0)
MONOS PCT: 5 % (ref 3–12)
Neutro Abs: 5.4 10*3/uL (ref 1.7–7.7)
Neutrophils Relative %: 80 % — ABNORMAL HIGH (ref 43–77)
Platelets: 257 10*3/uL (ref 150–400)
RBC: 4.36 MIL/uL (ref 3.87–5.11)
RDW: 12.4 % (ref 11.5–15.5)
WBC: 6.9 10*3/uL (ref 4.0–10.5)

## 2014-04-27 LAB — COMPREHENSIVE METABOLIC PANEL
ALBUMIN: 3.7 g/dL (ref 3.5–5.2)
ALK PHOS: 92 U/L (ref 39–117)
ALT: 15 U/L (ref 0–35)
AST: 18 U/L (ref 0–37)
Anion gap: 13 (ref 5–15)
BUN: 10 mg/dL (ref 6–23)
CO2: 24 meq/L (ref 19–32)
CREATININE: 0.77 mg/dL (ref 0.50–1.10)
Calcium: 9.5 mg/dL (ref 8.4–10.5)
Chloride: 103 mEq/L (ref 96–112)
GFR, EST NON AFRICAN AMERICAN: 81 mL/min — AB (ref >90)
Glucose, Bld: 118 mg/dL — ABNORMAL HIGH (ref 70–99)
POTASSIUM: 3.8 meq/L (ref 3.7–5.3)
Sodium: 140 mEq/L (ref 137–147)
Total Protein: 7.1 g/dL (ref 6.0–8.3)

## 2014-04-27 LAB — LIPASE, BLOOD: Lipase: 25 U/L (ref 11–59)

## 2014-04-27 MED ORDER — MECLIZINE HCL 25 MG PO TABS
25.0000 mg | ORAL_TABLET | Freq: Once | ORAL | Status: AC
  Administered 2014-04-27: 25 mg via ORAL
  Filled 2014-04-27: qty 1

## 2014-04-27 MED ORDER — ASPIRIN 325 MG PO TABS
325.0000 mg | ORAL_TABLET | Freq: Once | ORAL | Status: AC
  Administered 2014-04-27: 325 mg via ORAL
  Filled 2014-04-27: qty 1

## 2014-04-27 NOTE — ED Notes (Signed)
Pt placed on CCM, VSS, husband at bedside. Istat Troponin drawn

## 2014-04-27 NOTE — ED Notes (Signed)
Per EMS- Patient experienced epigastric pain, nausea, and dizziness after taking Lipozene 1500 mg x 2 tabs. Patient states she has only eaten 2 pieces of bologna all day which was prior to the [place]. EMS gave Zofran 4 mg IV prior to arrival to the ED. EKG-NSR.

## 2014-04-27 NOTE — ED Notes (Signed)
MRI is on their way to pick up patient for MRI

## 2014-04-27 NOTE — Discharge Instructions (Signed)
Vertigo Vertigo means you feel like you or your surroundings are moving when they are not. Vertigo can be dangerous if it occurs when you are at work, driving, or performing difficult activities.  CAUSES  Vertigo occurs when there is a conflict of signals sent to your brain from the visual and sensory systems in your body. There are many different causes of vertigo, including:  Infections, especially in the inner ear.  A bad reaction to a drug or misuse of alcohol and medicines.  Withdrawal from drugs or alcohol.  Rapidly changing positions, such as lying down or rolling over in bed.  A migraine headache.  Decreased blood flow to the brain.  Increased pressure in the brain from a head injury, infection, tumor, or bleeding. SYMPTOMS  You may feel as though the world is spinning around or you are falling to the ground. Because your balance is upset, vertigo can cause nausea and vomiting. You may have involuntary eye movements (nystagmus). DIAGNOSIS  Vertigo is usually diagnosed by physical exam. If the cause of your vertigo is unknown, your caregiver may perform imaging tests, such as an MRI scan (magnetic resonance imaging). TREATMENT  Most cases of vertigo resolve on their own, without treatment. Depending on the cause, your caregiver may prescribe certain medicines. If your vertigo is related to body position issues, your caregiver may recommend movements or procedures to correct the problem. In rare cases, if your vertigo is caused by certain inner ear problems, you may need surgery. HOME CARE INSTRUCTIONS   Follow your caregiver's instructions.  Avoid driving.  Avoid operating heavy machinery.  Avoid performing any tasks that would be dangerous to you or others during a vertigo episode.  Tell your caregiver if you notice that certain medicines seem to be causing your vertigo. Some of the medicines used to treat vertigo episodes can actually make them worse in some people. SEEK  IMMEDIATE MEDICAL CARE IF:   Your medicines do not relieve your vertigo or are making it worse.  You develop problems with talking, walking, weakness, or using your arms, hands, or legs.  You develop severe headaches.  Your nausea or vomiting continues or gets worse.  You develop visual changes.  A family member notices behavioral changes.  Your condition gets worse. MAKE SURE YOU:  Understand these instructions.  Will watch your condition.  Will get help right away if you are not doing well or get worse. Document Released: 03/25/2005 Document Revised: 09/07/2011 Document Reviewed: 01/01/2011 [facility] Patient Information 2015 [place], [place]. This information is not intended to replace advice given to you by your health care provider. Make sure you discuss any questions you have with your health care provider.   Chest Pain (Nonspecific) It is often hard to give a specific diagnosis for the cause of chest pain. There is always a chance that your pain could be related to something serious, such as a heart attack or a blood clot in the lungs. You need to follow up with your health care provider for further evaluation. CAUSES   Heartburn.  Pneumonia or bronchitis.  Anxiety or stress.  Inflammation around your heart (pericarditis) or lung (pleuritis or pleurisy).  A blood clot in the lung.  A collapsed lung (pneumothorax). It can develop suddenly on its own (spontaneous pneumothorax) or from trauma to the chest.  Shingles infection (herpes zoster virus). The chest wall is composed of bones, muscles, and cartilage. Any of these can be the source of the pain.  The bones can  be bruised by injury.  The muscles or cartilage can be strained by coughing or overwork.  The cartilage can be affected by inflammation and become sore (costochondritis). DIAGNOSIS  Lab tests or other studies may be needed to find the cause of your pain. Your health care provider may have you take a  test called an ambulatory electrocardiogram (ECG). An ECG records your heartbeat patterns over a 24-hour period. You may also have other tests, such as:  Transthoracic echocardiogram (TTE). During echocardiography, sound waves are used to evaluate how blood flows through your heart.  Transesophageal echocardiogram (TEE).  Cardiac monitoring. This allows your health care provider to monitor your heart rate and rhythm in real time.  Holter monitor. This is a portable device that records your heartbeat and can help diagnose heart arrhythmias. It allows your health care provider to track your heart activity for several days, if needed.  Stress tests by exercise or by giving medicine that makes the heart beat faster. TREATMENT   Treatment depends on what may be causing your chest pain. Treatment may include:  Acid blockers for heartburn.  Anti-inflammatory medicine.  Pain medicine for inflammatory conditions.  Antibiotics if an infection is present.  You may be advised to change lifestyle habits. This includes stopping smoking and avoiding alcohol, caffeine, and chocolate.  You may be advised to keep your head raised (elevated) when sleeping. This reduces the chance of acid going backward from your stomach into your esophagus. Most of the time, nonspecific chest pain will improve within 2-3 days with rest and mild pain medicine.  HOME CARE INSTRUCTIONS   If antibiotics were prescribed, take them as directed. Finish them even if you start to feel better.  For the next few days, avoid physical activities that bring on chest pain. Continue physical activities as directed.  Do not use any tobacco products, including cigarettes, chewing tobacco, or electronic cigarettes.  Avoid drinking alcohol.  Only take medicine as directed by your health care provider.  Follow your health care provider's suggestions for further testing if your chest pain does not go away.  Keep any follow-up  appointments you made. If you do not go to an appointment, you could develop lasting (chronic) problems with pain. If there is any problem keeping an appointment, call to reschedule. SEEK MEDICAL CARE IF:   Your chest pain does not go away, even after treatment.  You have a rash with blisters on your chest.  You have a fever. SEEK IMMEDIATE MEDICAL CARE IF:   You have increased chest pain or pain that spreads to your arm, neck, jaw, back, or abdomen.  You have shortness of breath.  You have an increasing cough, or you cough up blood.  You have severe back or abdominal pain.  You feel nauseous or vomit.  You have severe weakness.  You faint.  You have chills. This is an emergency. Do not wait to see if the pain will go away. Get medical help at once. Call your local emergency services (911 in [place] Do not drive yourself to the hospital. MAKE SURE YOU:   Understand these instructions.  Will watch your condition.  Will get help right away if you are not doing well or get worse. Document Released: 03/25/2005 Document Revised: 06/20/2013 Document Reviewed: 01/19/2008 [facility] Patient Information 2015 [place], [place]. This information is not intended to replace advice given to you by your health care provider. Make sure you discuss any questions you have with your health care provider.

## 2014-04-27 NOTE — ED Notes (Signed)
Patient transported to CT 

## 2014-04-27 NOTE — ED Notes (Signed)
Pt returned from MRI °

## 2014-04-27 NOTE — ED Notes (Signed)
Bed: YV57 Expected date:  Expected time:  Means of arrival:  Comments: EMS-diet pill made her sick

## 2014-04-27 NOTE — ED Provider Notes (Signed)
CSN: [id number]     Arrival date & time 04/27/14  1631 History   First MD Initiated Contact with Patient 04/27/14 1701     Chief Complaint  Patient presents with  . Abdominal Pain  . Nausea  . Dizziness     (Consider location/radiation/quality/duration/timing/severity/associated sxs/prior Treatment) Patient is a 73 y.o. female presenting with dizziness. The history is provided by the patient. No language interpreter was used.  Dizziness Quality:  Room spinning Severity:  Severe Onset quality:  Sudden Duration:  4 hours Timing:  Constant Progression:  Unchanged Chronicity:  New Context: head movement   Relieved by:  Nothing Worsened by:  Movement Ineffective treatments:  Being still Associated symptoms: chest pain   Associated symptoms: no blood in stool, no diarrhea, no headaches, no nausea, no palpitations, no shortness of breath and no vomiting   Chest pain:    Quality:  Pressure   Severity:  Moderate   Onset quality:  Sudden   Duration: 2-3 hours.   Timing:  Constant   Progression:  Resolved   Chronicity:  New Risk factors: hx of vertigo and Meniere's disease (per pt, not noted in PMHx)     History reviewed. No pertinent past medical history. Past Surgical History  Procedure Laterality Date  . Cholecystectomy    . Tonsillectomy    . Fracture surgery     Family History  Problem Relation Age of Onset  . Cancer Father    History  Substance Use Topics  . Smoking status: Former Research scientist (life sciences)  . Smokeless tobacco: Never Used  . Alcohol Use: No   OB History   Grav Para Term Preterm Abortions TAB SAB Ect Mult Living                 Review of Systems  Constitutional: Negative for fever, chills, diaphoresis, activity change, appetite change and fatigue.  HENT: Negative for congestion, facial swelling, rhinorrhea and sore throat.   Eyes: Negative for photophobia and discharge.  Respiratory: Negative for cough, chest tightness and shortness of breath.    Cardiovascular: Positive for chest pain. Negative for palpitations and leg swelling.  Gastrointestinal: Positive for abdominal pain. Negative for nausea, vomiting, diarrhea and blood in stool.  Endocrine: Negative for polydipsia and polyuria.  Genitourinary: Negative for dysuria, frequency, difficulty urinating and pelvic pain.  Musculoskeletal: Negative for arthralgias, back pain, neck pain and neck stiffness.  Skin: Negative for color change and wound.  Allergic/Immunologic: Negative for immunocompromised state.  Neurological: Positive for dizziness. Negative for facial asymmetry, weakness, numbness and headaches.  Hematological: Does not bruise/bleed easily.  Psychiatric/Behavioral: Negative for confusion and agitation.      Allergies  Review of patient's allergies indicates no known allergies.  Home Medications   Prior to Admission medications   Medication Sig Start Date End Date Taking? Authorizing Provider  cetirizine (ZYRTEC) 10 MG tablet Take 10 mg by mouth daily.   Yes Historical Provider, MD  fluticasone (FLONASE) 50 MCG/ACT nasal spray Place 2 sprays into both nostrils daily. 04/19/14  Yes Gerda Diss, DO  meclizine (ANTIVERT) 12.5 MG tablet Take 12.5 mg by mouth daily.    Yes Historical Provider, MD  LORazepam (ATIVAN) 1 MG tablet Take 1 mg by mouth 3 (three) times daily as needed for anxiety.    Historical Provider, MD   BP 140/84  Pulse 70  Resp 16  SpO2 100% Physical Exam  Constitutional: She is oriented to person, place, and time. She appears well-developed and well-nourished. No distress.  HENT:  Head: Normocephalic and atraumatic.  Mouth/Throat: No oropharyngeal exudate.  Eyes: Pupils are equal, round, and reactive to light.  Neck: Normal range of motion. Neck supple.  Cardiovascular: Normal rate, regular rhythm and normal heart sounds.  Exam reveals no gallop and no friction rub.   No murmur heard. Pulmonary/Chest: Effort normal and breath sounds  normal. No respiratory distress. She has no wheezes. She has no rales.  Abdominal: Soft. Bowel sounds are normal. She exhibits no distension and no mass. There is no tenderness. There is no rebound and no guarding.  Musculoskeletal: Normal range of motion. She exhibits no edema and no tenderness.  Neurological: She is alert and oriented to person, place, and time. She has normal strength. She displays no tremor. No cranial nerve deficit or sensory deficit. She exhibits normal muscle tone. She displays a negative Romberg sign. Gait (ataxia) abnormal. Coordination normal. GCS eye subscore is 4. GCS verbal subscore is 5. GCS motor subscore is 6.  Horizontal nystagmus with movement and at rest. Past phase is pointing to the left  Skin: Skin is warm and dry.  Psychiatric: She has a normal mood and affect.    ED Course  Procedures (including critical care time) Labs Review Labs Reviewed  CBC WITH DIFFERENTIAL - Abnormal; Notable for the following:    Neutrophils Relative % 80 (*)    All other components within normal limits  COMPREHENSIVE METABOLIC PANEL - Abnormal; Notable for the following:    Glucose, Bld 118 (*)    Total Bilirubin <0.2 (*)    GFR calc non Af Amer 81 (*)    All other components within normal limits  LIPASE, BLOOD  I-STAT TROPOININ, ED  I-STAT TROPOININ, ED    Imaging Review Dg Chest 2 View  04/27/2014   CLINICAL DATA:  Dizziness and nausea for 1 day.  EXAM: CHEST  2 VIEW  COMPARISON:  None.  FINDINGS: The heart size and mediastinal contours are within normal limits. Both lungs are clear. The visualized skeletal structures are unremarkable.  IMPRESSION: No active cardiopulmonary disease.   Electronically Signed   By: Maryclare Bean M.D.   On: 04/27/2014 18:16   Ct Head Wo Contrast  04/27/2014   CLINICAL DATA:  Nausea.  Dizziness.  EXAM: CT HEAD WITHOUT CONTRAST  TECHNIQUE: Contiguous axial images were obtained from the base of the skull through the vertex without intravenous  contrast.  COMPARISON:  12/05/2013.  FINDINGS: No intra-axial or extra-axial pathologic fluid or blood collection. No mass lesion. No hydrocephalus. No hemorrhage. No acute bony abnormality. Visualized paranasal sinuses mastoids are clear.  IMPRESSION: No acute abnormality .   Electronically Signed   By: Marcello Moores  Register   On: 04/27/2014 18:24   Mr Brain Wo Contrast  04/27/2014   CLINICAL DATA:  Nausea, vomiting, vertigo beginning today after new medication.  EXAM: MRI HEAD WITHOUT CONTRAST  TECHNIQUE: Multiplanar, multiecho pulse sequences of the brain and surrounding structures were obtained without intravenous contrast.  COMPARISON:  CT of the head April 27, 2014 at 1808 hr  FINDINGS: No reduced diffusion to suggest acute ischemia. No susceptibility artifact is shift of hemorrhage. The ventricles and sulci are normal for patient's age. Minimal white matter changes suggest chronic small vessel ischemic disease, less than expected for age. 3 mm T2 hyperintensity within the mesial LEFT thalamus could reflect remote lacunar infarct or, perivascular space. No midline shift or mass effect.  No abnormal extra-axial fluid collections. Normal major intracranial vascular flow voids seen at  the skull base.  Ocular globes and orbital contents are unremarkable. Trace paranasal sinus mucosal thickening with a small LEFT maxillary mucosal retention cyst. Minimal fluid signal in LEFT mastoid tip.  No abnormal sellar expansion. No cerebellar tonsillar ectopia. No suspicious calvarial bone marrow signal.  IMPRESSION: No acute intracranial process; normal noncontrast MRI of the brain for age.   Electronically Signed   By: Elon Alas   On: 04/27/2014 22:16     EKG Interpretation   Date/Time:  Friday April 27 2014 16:51:24 EDT Ventricular Rate:  66 PR Interval:  177 QRS Duration: 81 QT Interval:  426 QTC Calculation: 446 R Axis:   -17 Text Interpretation:  Sinus rhythm Borderline left axis deviation Low   voltage, precordial leads No significant change since last tracing  Confirmed by [place] [phone]) on 04/27/2014 5:10:56 PM      MDM   Final diagnoses:  Chest pain  Vertigo    Pt is a 73 y.o. female with Pmhx as above who presents with sudden onset dizziness described as room spinning, nausea, diaphoresis, chest pressure while turning her head in the car today. She states she has a history of vertigo but is not sure if this is similar. She states that the vertigo is present even when she is still. The chest pressure was described as central, lasted 2 or 3 hours and has since resolved. On physical exam vital signs are stable and patient is in no acute distress. She has persistent horizontal nystagmus even at rest. She is ataxic. There are no other focal neurologic findings. CT head, CXR, first trop negative. Spoke w/ neurology who suspects peripheral vertigo given reported hx, but MRi would be diagnostic. Will get MRI.  MRI nml, pt feeling improved after meclizine, though still has mild baseline horizontal nystagmus, though has ambulated, tolerated PO. 3hr delta trop drawn and was also negative. Will d/c home w/ instructions for PRN meclizine use, close f/u with PCP, and Return precautions given for new or worsening symptoms including recurrent CP. Doubt ACS given no hx of HTN, HLD, tob use, hx of CAD. Despite EMS note from nursing, I do not feel her diet pill was cause of symptoms.          Ernestina Patches, MD 04/28/14 1215

## 2014-04-27 NOTE — ED Notes (Signed)
Patient transported to X-ray 

## 2014-04-27 NOTE — ED Notes (Signed)
Pt to MRI via stretcher, 3 necklace, 2 pr earrings and 2 hearing aids in denture cup at bedside.

## 2014-05-15 ENCOUNTER — Ambulatory Visit (INDEPENDENT_AMBULATORY_CARE_PROVIDER_SITE_OTHER): Payer: Medicare PPO | Admitting: Sports Medicine

## 2014-05-15 ENCOUNTER — Ambulatory Visit (INDEPENDENT_AMBULATORY_CARE_PROVIDER_SITE_OTHER): Payer: Medicare PPO

## 2014-05-15 ENCOUNTER — Ambulatory Visit: Payer: Medicare PPO

## 2014-05-15 VITALS — BP 136/84 | HR 81 | Temp 97.8°F | Resp 16 | Ht 64.25 in | Wt 210.6 lb

## 2014-05-15 DIAGNOSIS — M25562 Pain in left knee: Secondary | ICD-10-CM

## 2014-05-15 MED ORDER — MELOXICAM 15 MG PO TABS: 15.0000 mg | ORAL_TABLET | Freq: Every day | ORAL | Status: DC

## 2014-05-15 NOTE — Progress Notes (Addendum)
   Subjective:    Patient ID: Martha Lara, female    DOB: 08/04/1940, 73 y.o.   MRN: [id number]  HPI Martha Lara is a 73 year old female who presents with left knee pain. She denies any acute injury history. Location is left posterior knee. She says that it started hurting earlier today as she got off an elevator. She tired taking two aspirins. Aggravated with walking. Character is a "pulling" sensation behind her knee. Also has pain in the anterior knee both medially and laterally. She notes mild fullness behind her knee. She is on jury duty this week. She denies any fevers, chills, numbness, tingling, or weakness. 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 She has also tried wearing a knee brace.  Denies any calf pain or swelling.  She has seen Dr. Lorre Nick at [facility] Orthopedic in the past for non-related conditions.  Past medical history, social history, medications, and allergies were reviewed and are up to date in the chart.  Review of Systems 7 point review of systems was performed and was otherwise negative unless noted in the history of present illness.     Objective:   Physical Exam BP 136/84 mmHg  Pulse 81  Temp(Src) 97.8 F (36.6 C) (Oral)  Resp 16  Ht 5' 4.25" (1.632 m)  Wt 210 lb 9.6 oz (95.528 kg)  BMI 35.87 kg/m2  SpO2 99% GEN: The patient is well-developed well-nourished female and in no acute distress.  She is awake alert and oriented x3. SKIN: warm and well-perfused, no rash  EXTR: No lower extremity edema or calf tenderness Neuro: Strength 5/5 globally. Sensation intact throughout. No focal deficits. Vasc: +2 bilateral distal pulses. No edema.  MSK: examination left knee reveals range of motion from 0-130 with minimal pain. There is a palpable fullness posterior to the left knee. She has no calf pain, warmth, swelling, or induration. Trace left knee effusion. No laxity with valgus or varus testing.  Negative Lachman's. Negative McMurray's. She does have medial and lateral joint line tenderness to palpation.  X-rays: left knee and bilateral standing knee x-rays were obtained today which show mild narrowing of the medial compartment  Of the left knee. There are no acute fracture seen or bony malignment.     Assessment & Plan:  1. 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  -we will start her on meloxicam 15 mg by mouth daily as needed for pain -She'll continue to wear her knee brace, ice, and elevate as tolerated -We provided her with a copy of her x-rays today -She is recommended to follow-up with her orthopedic surgeon for further treatment options -We will see her back if needed  Dr. Linnell Fulling, DO Sports Medicine Fellow

## 2014-05-15 NOTE — Patient Instructions (Signed)
Plan to follow-up with your Orthopedic Physician. If you have any problems, please call us. We provided a disc with Knee x-rays, including bilateral standing knee x-rays. Take meloxicam as prescribed. Baker Cyst A Baker cyst is a sac-like structure that forms in the back of the knee. It is filled with the same fluid that is located in your knee. This fluid lubricates the bones and cartilage of the knee and allows them to move over each other more easily. CAUSES  When the knee becomes injured or inflamed, increased fluid forms in the knee. When this happens, the joint lining is pushed out behind the knee and forms the Baker cyst. This cyst may also be caused by inflammation from arthritic conditions and infections. SIGNS AND SYMPTOMS  A Baker cyst usually has no symptoms. When the cyst is substantially enlarged:  You may feel pressure behind the knee, stiffness in the knee, or a mass in the area behind the knee.  You may develop pain, redness, and swelling in the calf. This can suggest a blood clot and requires evaluation by your health care provider. DIAGNOSIS  A Baker cyst is most often found during an ultrasound exam. This exam may have been performed for other reasons, and the cyst was found incidentally. Sometimes an MRI is used. This picks up other problems within a joint that an ultrasound exam may not. If the Baker cyst developed immediately after an injury, X-ray exams may be used to diagnose the cyst. TREATMENT  The treatment depends on the cause of the cyst. Anti-inflammatory medicines and rest often will be prescribed. If the cyst is caused by a bacterial infection, antibiotic medicines may be prescribed.  HOME CARE INSTRUCTIONS   If the cyst was caused by an injury, for the first 24 hours, keep the injured leg elevated on 2 pillows while lying down.  For the first 24 hours while you are awake, apply ice to the injured area:  Put ice in a plastic bag.  Place a towel between your  skin and the bag.  Leave the ice on for 20 minutes, 2-3 times a day.  Only take over-the-counter or prescription medicines for pain, discomfort, or fever as directed by your health care provider.  Only take antibiotic medicine as directed. Make sure to finish it even if you start to feel better. MAKE SURE YOU:   Understand these instructions.  Will watch your condition.  Will get help right away if you are not doing well or get worse. Document Released: 06/15/2005 Document Revised: 04/05/2013 Document Reviewed: 01/25/2013 [facility] Patient Information 2015 [place], [place]. This information is not intended to replace advice given to you by your health care provider. Make sure you discuss any questions you have with your health care provider.

## 2014-07-13 ENCOUNTER — Other Ambulatory Visit: Payer: Self-pay | Admitting: Sports Medicine

## 2014-10-15 DIAGNOSIS — E119 Type 2 diabetes mellitus without complications: Secondary | ICD-10-CM | POA: Diagnosis not present

## 2014-10-15 DIAGNOSIS — H2513 Age-related nuclear cataract, bilateral: Secondary | ICD-10-CM | POA: Diagnosis not present

## 2014-11-02 DIAGNOSIS — H2511 Age-related nuclear cataract, right eye: Secondary | ICD-10-CM | POA: Diagnosis not present

## 2014-11-15 DIAGNOSIS — H2512 Age-related nuclear cataract, left eye: Secondary | ICD-10-CM | POA: Diagnosis not present

## 2014-11-28 DIAGNOSIS — H2512 Age-related nuclear cataract, left eye: Secondary | ICD-10-CM | POA: Diagnosis not present

## 2015-03-15 ENCOUNTER — Other Ambulatory Visit: Payer: Self-pay | Admitting: Internal Medicine

## 2015-03-15 DIAGNOSIS — E119 Type 2 diabetes mellitus without complications: Secondary | ICD-10-CM | POA: Diagnosis not present

## 2015-03-15 DIAGNOSIS — Z1231 Encounter for screening mammogram for malignant neoplasm of breast: Secondary | ICD-10-CM

## 2015-03-15 DIAGNOSIS — H8113 Benign paroxysmal vertigo, bilateral: Secondary | ICD-10-CM | POA: Diagnosis not present

## 2015-03-15 DIAGNOSIS — Z1389 Encounter for screening for other disorder: Secondary | ICD-10-CM | POA: Diagnosis not present

## 2015-03-15 DIAGNOSIS — E782 Mixed hyperlipidemia: Secondary | ICD-10-CM | POA: Diagnosis not present

## 2015-03-15 DIAGNOSIS — Z0000 Encounter for general adult medical examination without abnormal findings: Secondary | ICD-10-CM | POA: Diagnosis not present

## 2015-03-15 DIAGNOSIS — J309 Allergic rhinitis, unspecified: Secondary | ICD-10-CM | POA: Diagnosis not present

## 2015-03-15 DIAGNOSIS — R21 Rash and other nonspecific skin eruption: Secondary | ICD-10-CM | POA: Diagnosis not present

## 2015-03-22 ENCOUNTER — Ambulatory Visit
Admission: RE | Admit: 2015-03-22 | Discharge: 2015-03-22 | Disposition: A | Discharge status: Home / Self Care | Payer: Medicare PPO | Source: Ambulatory Visit | Attending: Internal Medicine | Admitting: Internal Medicine

## 2015-03-22 DIAGNOSIS — Z1231 Encounter for screening mammogram for malignant neoplasm of breast: Secondary | ICD-10-CM | POA: Diagnosis not present

## 2015-03-25 ENCOUNTER — Other Ambulatory Visit: Payer: Self-pay | Admitting: Internal Medicine

## 2015-03-25 DIAGNOSIS — R928 Other abnormal and inconclusive findings on diagnostic imaging of breast: Secondary | ICD-10-CM

## 2015-03-25 DIAGNOSIS — Z961 Presence of intraocular lens: Secondary | ICD-10-CM | POA: Diagnosis not present

## 2015-04-01 ENCOUNTER — Other Ambulatory Visit: Payer: Medicare PPO

## 2015-04-08 ENCOUNTER — Ambulatory Visit
Admission: RE | Admit: 2015-04-08 | Discharge: 2015-04-08 | Disposition: A | Discharge status: Home / Self Care | Payer: Medicare PPO | Source: Ambulatory Visit | Attending: Internal Medicine | Admitting: Internal Medicine

## 2015-04-08 DIAGNOSIS — N6012 Diffuse cystic mastopathy of left breast: Secondary | ICD-10-CM | POA: Diagnosis not present

## 2015-04-08 DIAGNOSIS — N6002 Solitary cyst of left breast: Secondary | ICD-10-CM | POA: Diagnosis not present

## 2015-04-08 DIAGNOSIS — R928 Other abnormal and inconclusive findings on diagnostic imaging of breast: Secondary | ICD-10-CM

## 2015-04-30 DIAGNOSIS — H9311 Tinnitus, right ear: Secondary | ICD-10-CM | POA: Diagnosis not present

## 2015-04-30 DIAGNOSIS — H903 Sensorineural hearing loss, bilateral: Secondary | ICD-10-CM | POA: Diagnosis not present

## 2015-04-30 DIAGNOSIS — H8101 Meniere's disease, right ear: Secondary | ICD-10-CM | POA: Diagnosis not present

## 2015-05-09 ENCOUNTER — Ambulatory Visit (INDEPENDENT_AMBULATORY_CARE_PROVIDER_SITE_OTHER): Payer: Medicare PPO | Admitting: Physician Assistant

## 2015-05-09 VITALS — BP 130/80 | HR 86 | Temp 98.5°F | Resp 17 | Ht 65.0 in | Wt 205.8 lb

## 2015-05-09 DIAGNOSIS — J069 Acute upper respiratory infection, unspecified: Secondary | ICD-10-CM | POA: Diagnosis not present

## 2015-05-09 MED ORDER — GUAIFENESIN ER 1200 MG PO TB12: 1.0000 | ORAL_TABLET | Freq: Two times a day (BID) | ORAL | Status: DC | PRN

## 2015-05-09 MED ORDER — IPRATROPIUM BROMIDE 0.03 % NA SOLN: 2.0000 | Freq: Two times a day (BID) | NASAL | Status: DC

## 2015-05-09 MED ORDER — BENZONATATE 100 MG PO CAPS: 100.0000 mg | ORAL_CAPSULE | Freq: Three times a day (TID) | ORAL | Status: DC | PRN

## 2015-05-09 NOTE — Progress Notes (Signed)
   Subjective:    Patient ID: Martha Lara, female    DOB: [birth year], 74 y.o.   MRN: [id number]  HPI Patient presents for cough and congestion that have been present for the past 4 days. Cough is sometimes productive. Additionally endorses sneezing, rhinorrhea, and right side headache. Denies fever, sinus pressure, sore throat, SOB, CP, or wheezing. Has not tried any medications to remedy. Takes flonase occasionally for seasonal allergies. Denies h/o asthma. NKDA.   Review of Systems As noted above.    Objective:   Physical Exam  Constitutional: She is oriented to person, place, and time. She appears well-developed and well-nourished. No distress.  Blood pressure 130/80, pulse 86, temperature 98.5 F (36.9 C), temperature source Oral, resp. rate 17, height 5\' 5"  (1.651 m), weight 205 lb 12.8 oz (93.35 kg), SpO2 96 %.   HENT:  Head: Normocephalic and atraumatic.  Right Ear: Tympanic membrane, external ear and ear canal normal.  Left Ear: Tympanic membrane, external ear and ear canal normal.  Nose: Rhinorrhea (with erythema) present. Right sinus exhibits no maxillary sinus tenderness and no frontal sinus tenderness. Left sinus exhibits no maxillary sinus tenderness and no frontal sinus tenderness.  Mouth/Throat: Uvula is midline, oropharynx is clear and moist and mucous membranes are normal. No oropharyngeal exudate, posterior oropharyngeal edema or posterior oropharyngeal erythema.  Eyes: Conjunctivae are normal. Pupils are equal, round, and reactive to light. Right eye exhibits no discharge. Left eye exhibits no discharge. No scleral icterus.  Neck: Normal range of motion. Neck supple. No thyromegaly present.  Cardiovascular: Normal rate, regular rhythm and normal heart sounds.  Exam reveals no gallop and no friction rub.   No murmur heard. Pulmonary/Chest: Effort normal and breath sounds normal. No respiratory distress. She has no decreased breath sounds. She has no wheezes. She has no  rhonchi. She has no rales.  Abdominal: Soft. Bowel sounds are normal. She exhibits no distension. There is no tenderness. There is no rebound and no guarding.  Lymphadenopathy:    She has no cervical adenopathy.  Neurological: She is alert and oriented to person, place, and time.  Skin: Skin is warm and dry. No rash noted. She is not diaphoretic. No erythema.        Assessment & Plan:  1. Acute upper respiratory infection Drink plenty of fluid with mucinex. May continue flonase, however, explained that does not work well with inconsistent use. If no improvement in 1 week will send amoxicillin. - ipratropium (ATROVENT) 0.03 % nasal spray; Place 2 sprays into both nostrils 2 (two) times daily.  Dispense: 30 mL; Refill: 0 - benzonatate (TESSALON) 100 MG capsule; Take 1-2 capsules (100-200 mg total) by mouth 3 (three) times daily as needed for cough.  Dispense: 40 capsule; Refill: 0 - Guaifenesin (MUCINEX MAXIMUM STRENGTH) 1200 MG TB12; Take 1 tablet (1,200 mg total) by mouth every 12 (twelve) hours as needed.  Dispense: 14 tablet; Refill: [place] PA-C  [facility] 05/09/2015 11:00 AM

## 2015-05-09 NOTE — Patient Instructions (Signed)
Upper Respiratory Infection, Adult Most upper respiratory infections (URIs) are a viral infection of the air passages leading to the lungs. A URI affects the nose, throat, and upper air passages. The most common type of URI is nasopharyngitis and is typically referred to as "the common cold." URIs run their course and usually go away on their own. Most of the time, a URI does not require medical attention, but sometimes a bacterial infection in the upper airways can follow a viral infection. This is called a secondary infection. Sinus and middle ear infections are common types of secondary upper respiratory infections. Bacterial pneumonia can also complicate a URI. A URI can worsen asthma and chronic obstructive pulmonary disease (COPD). Sometimes, these complications can require emergency medical care and may be life threatening.  CAUSES Almost all URIs are caused by viruses. A virus is a type of germ and can spread from one person to another.  RISKS FACTORS You may be at risk for a URI if:   You smoke.   You have chronic heart or lung disease.  You have a weakened defense (immune) system.   You are very young or very old.   You have nasal allergies or asthma.  You work in crowded or poorly ventilated areas.  You work in health care facilities or schools. SIGNS AND SYMPTOMS  Symptoms typically develop 2-3 days after you come in contact with a cold virus. Most viral URIs last 7-10 days. However, viral URIs from the influenza virus (flu virus) can last 14-18 days and are typically more severe. Symptoms may include:   Runny or stuffy (congested) nose.   Sneezing.   Cough.   Sore throat.   Headache.   Fatigue.   Fever.   Loss of appetite.   Pain in your forehead, behind your eyes, and over your cheekbones (sinus pain).  Muscle aches.  DIAGNOSIS  Your health care provider may diagnose a URI by:  Physical exam.  Tests to check that your symptoms are not due to  another condition such as:  Strep throat.  Sinusitis.  Pneumonia.  Asthma. TREATMENT  A URI goes away on its own with time. It cannot be cured with medicines, but medicines may be prescribed or recommended to relieve symptoms. Medicines may help:  Reduce your fever.  Reduce your cough.  Relieve nasal congestion. HOME CARE INSTRUCTIONS   Take medicines only as directed by your health care provider.   Gargle warm saltwater or take cough drops to comfort your throat as directed by your health care provider.  Use a warm mist humidifier or inhale steam from a shower to increase air moisture. This may make it easier to breathe.  Drink enough fluid to keep your urine clear or pale yellow.   Eat soups and other clear broths and maintain good nutrition.   Rest as needed.   Return to work when your temperature has returned to normal or as your health care provider advises. You may need to stay home longer to avoid infecting others. You can also use a face mask and careful hand washing to prevent spread of the virus.  Increase the usage of your inhaler if you have asthma.   Do not use any tobacco products, including cigarettes, chewing tobacco, or electronic cigarettes. If you need help quitting, ask your health care provider. PREVENTION  The best way to protect yourself from getting a cold is to practice good hygiene.   Avoid oral or hand contact with people with cold   symptoms.   Wash your hands often if contact occurs.  There is no clear evidence that vitamin C, vitamin E, echinacea, or exercise reduces the chance of developing a cold. However, it is always recommended to get plenty of rest, exercise, and practice good nutrition.  SEEK MEDICAL CARE IF:   You are getting worse rather than better.   Your symptoms are not controlled by medicine.   You have chills.  You have worsening shortness of breath.  You have brown or red mucus.  You have yellow or brown nasal  discharge.  You have pain in your face, especially when you bend forward.  You have a fever.  You have swollen neck glands.  You have pain while swallowing.  You have white areas in the back of your throat. SEEK IMMEDIATE MEDICAL CARE IF:   You have severe or persistent:  Headache.  Ear pain.  Sinus pain.  Chest pain.  You have chronic lung disease and any of the following:  Wheezing.  Prolonged cough.  Coughing up blood.  A change in your usual mucus.  You have a stiff neck.  You have changes in your:  Vision.  Hearing.  Thinking.  Mood. MAKE SURE YOU:   Understand these instructions.  Will watch your condition.  Will get help right away if you are not doing well or get worse.   This information is not intended to replace advice given to you by your health care provider. Make sure you discuss any questions you have with your health care provider.   Document Released: 12/09/2000 Document Revised: 10/30/2014 Document Reviewed: 09/20/2013 [employer] Interactive Patient Education 2016 [employer]  

## 2015-06-10 DIAGNOSIS — H8101 Meniere's disease, right ear: Secondary | ICD-10-CM | POA: Diagnosis not present

## 2015-06-10 DIAGNOSIS — H903 Sensorineural hearing loss, bilateral: Secondary | ICD-10-CM | POA: Diagnosis not present

## 2015-07-30 ENCOUNTER — Ambulatory Visit (INDEPENDENT_AMBULATORY_CARE_PROVIDER_SITE_OTHER): Payer: Medicare Other | Admitting: Physician Assistant

## 2015-07-30 ENCOUNTER — Ambulatory Visit: Payer: Self-pay

## 2015-07-30 VITALS — BP 132/70 | HR 80 | Temp 98.4°F | Resp 20 | Ht 65.0 in | Wt 205.0 lb

## 2015-07-30 DIAGNOSIS — J99 Respiratory disorders in diseases classified elsewhere: Secondary | ICD-10-CM | POA: Diagnosis not present

## 2015-07-30 DIAGNOSIS — J302 Other seasonal allergic rhinitis: Secondary | ICD-10-CM | POA: Diagnosis not present

## 2015-07-30 DIAGNOSIS — T7840XA Allergy, unspecified, initial encounter: Secondary | ICD-10-CM

## 2015-07-30 DIAGNOSIS — Z9189 Other specified personal risk factors, not elsewhere classified: Secondary | ICD-10-CM

## 2015-07-30 MED ORDER — IPRATROPIUM BROMIDE 0.03 % NA SOLN: 2.0000 | Freq: Two times a day (BID) | NASAL | Status: DC

## 2015-07-30 MED ORDER — CETIRIZINE HCL 10 MG PO TABS: 10.0000 mg | ORAL_TABLET | Freq: Every day | ORAL | Status: DC

## 2015-07-30 MED ORDER — GUAIFENESIN ER 1200 MG PO TB12: 1.0000 | ORAL_TABLET | Freq: Two times a day (BID) | ORAL | Status: DC | PRN

## 2015-07-30 NOTE — Patient Instructions (Signed)
Please hydrate well with 64 oz of water.   Please take zyrtec 10mg  every day.   Hay Fever Hay fever is an allergic reaction to particles in the air. It cannot be passed from person to person. It cannot be cured, but it can be controlled. CAUSES  Hay fever is caused by something that triggers an allergic reaction (allergens). The following are examples of allergens:  Ragweed.  Feathers.  Animal dander.  Grass and tree pollens.  Cigarette smoke.  House dust.  Pollution. SYMPTOMS   Sneezing.  Runny or stuffy nose.  Tearing eyes.  Itchy eyes, nose, mouth, throat, skin, or other area.  Sore throat.  Headache.  Decreased sense of smell or taste. DIAGNOSIS Your caregiver will perform a physical exam and ask questions about the symptoms you are having.Allergy testing may be done to determine exactly what triggers your hay fever.  TREATMENT   Over-the-counter medicines may help symptoms. These include:  Antihistamines.  Decongestants. These may help with nasal congestion.  Your caregiver may prescribe medicines if over-the-counter medicines do not work.  Some people benefit from allergy shots when other medicines are not helpful. HOME CARE INSTRUCTIONS   Avoid the allergen that is causing your symptoms, if possible.  Take all medicine as told by your caregiver. SEEK MEDICAL CARE IF:   You have severe allergy symptoms and your current medicines are not helping.  Your treatment was working at one time, but you are now experiencing symptoms.  You have sinus congestion and pressure.  You develop a fever or headache.  You have thick nasal discharge.  You have asthma and have a worsening cough and wheezing. SEEK IMMEDIATE MEDICAL CARE IF:   You have swelling of your tongue or lips.  You have trouble breathing.  You feel lightheaded or like you are going to faint.  You have cold sweats.  You have a fever.   This information is not intended to replace  advice given to you by your health care provider. Make sure you discuss any questions you have with your health care provider.   Document Released: 06/15/2005 Document Revised: 09/07/2011 Document Reviewed: 12/26/2014 Elsevier Interactive Patient Education [employer].

## 2015-07-30 NOTE — Progress Notes (Signed)
[facility] [address] [place] [address] [phone]  Date:  07/30/2015   Name:  Martha Lara   DOB:  11-21-1940   MRN:  [id number]  PCP:  Wenda Low, MD   Chief Complaint  Patient presents with  . Cough    COUPLES WEEK  . Nasal Congestion    History of Present Illness:  Martha Lara is a 75 y.o. female patient who presents to [facility] for cc of congestion.  This has been for a couple of weeks.  She is coughing productive phlegm, however does not come to her mouth.  She is having hoarseness.  She tried to use dayquil and Nyquil which did not help. No sore throat.  No ear pain.  No blowing nose.  No wheezing.  No sob or dyspnea.  No fever.  No fatigue.  She is having watery eyes and heavy sneezing.  When asked multiple times of possible allergy exposure, she confesses to have a new cat.  The cats are indoor and roam freely around the house and her bedroom.  She hold them at her chest throughout the day.   She has no other allergy exposure.     There are no active problems to display for this patient.   History reviewed. No pertinent past medical history.  Past Surgical History  Procedure Laterality Date  . Cholecystectomy    . Tonsillectomy    . Fracture surgery      Social History  Substance Use Topics  . Smoking status: Former Research scientist (life sciences)  . Smokeless tobacco: Never Used  . Alcohol Use: No    Family History  Problem Relation Age of Onset  . Cancer Father     No Known Allergies  Medication list has been reviewed and updated.  Current Outpatient Prescriptions on File Prior to Visit  Medication Sig Dispense Refill  . cetirizine (ZYRTEC) 10 MG tablet Take 10 mg by mouth daily.    . fluticasone (FLONASE) 50 MCG/ACT nasal spray Place 2 sprays into both nostrils daily. 16 g 1  . meclizine (ANTIVERT) 12.5 MG tablet Take 12.5 mg by mouth daily.     . benzonatate (TESSALON) 100 MG capsule Take 1-2 capsules (100-200 mg total) by mouth 3 (three)  times daily as needed for cough. (Patient not taking: Reported on 07/30/2015) 40 capsule 0  . Guaifenesin (MUCINEX MAXIMUM STRENGTH) 1200 MG TB12 Take 1 tablet (1,200 mg total) by mouth every 12 (twelve) hours as needed. (Patient not taking: Reported on 07/30/2015) 14 tablet 1  . ipratropium (ATROVENT) 0.03 % nasal spray Place 2 sprays into both nostrils 2 (two) times daily. (Patient not taking: Reported on 07/30/2015) 30 mL 0  . LORazepam (ATIVAN) 1 MG tablet Take 1 mg by mouth 3 (three) times daily as needed for anxiety. Reported on 07/30/2015     No current facility-administered medications on file prior to visit.    ROS ROS otherwise unremarkable unless listed above.   Physical Examination: BP 132/70 mmHg  Pulse 80  Temp(Src) 98.4 F (36.9 C) (Oral)  Resp 20  Ht 5\' 5"  (1.651 m)  Wt 205 lb (92.987 kg)  BMI 34.11 kg/m2  SpO2 98% Ideal Body Weight: Weight in (lb) to have BMI = 25: 149.9  Physical Exam  Constitutional: She is oriented to person, place, and time. She appears well-developed and well-nourished. No distress.  HENT:  Head: Normocephalic and atraumatic.  Right Ear: Tympanic membrane, external ear and ear canal normal.  Left Ear: Tympanic membrane, external ear and ear canal normal.  Nose: Mucosal edema and rhinorrhea present. Right sinus exhibits no maxillary sinus tenderness and no frontal sinus tenderness. Left sinus exhibits no maxillary sinus tenderness and no frontal sinus tenderness.  Mouth/Throat: No uvula swelling. No oropharyngeal exudate, posterior oropharyngeal edema or posterior oropharyngeal erythema.  Eyes: Conjunctivae and EOM are normal. Pupils are equal, round, and reactive to light.  Cardiovascular: Normal rate and regular rhythm.  Exam reveals no gallop, no distant heart sounds and no friction rub.   No murmur heard. Pulmonary/Chest: Effort normal. No respiratory distress. She has no decreased breath sounds. She has no wheezes. She has no rhonchi.   Lymphadenopathy:       Head (right side): No submandibular, no tonsillar, no preauricular and no posterior auricular adenopathy present.       Head (left side): No submandibular, no tonsillar, no preauricular and no posterior auricular adenopathy present.  Neurological: She is alert and oriented to person, place, and time.  Skin: She is not diaphoretic.  Psychiatric: She has a normal mood and affect. Her behavior is normal.     Assessment and Plan: [name] is a 75 y.o. female who is here today for cc of nasal congestion, rhinorrhea, and minor cough. This appears to be allergies.  I am suspicious that she is developing an allergy toward her felines.  I have advised her to avoid triggers--shutting the door to her room.  Not allowing them to come into her room or furniture, and not holding them so close to her face.  I have also advised that she may do better with an air purifier.  Patient states she may put the cats in the basement to see if this improves her allergy status.  In the meantime, we will treat supportively with anti-histamine, and nasal spray.  She will contact me if sxs do not  Improve.   Seasonal allergies - Plan: Guaifenesin (MUCINEX MAXIMUM STRENGTH) 1200 MG TB12, ipratropium (ATROVENT) 0.03 % nasal spray, cetirizine (ZYRTEC) 10 MG tablet  Disposition to allergy in upper respiratory tract (HCC)  Ivar Drape, PA-C [facility] 07/30/2015 4:55 PM

## 2015-08-03 ENCOUNTER — Ambulatory Visit (INDEPENDENT_AMBULATORY_CARE_PROVIDER_SITE_OTHER): Payer: Medicare Other | Admitting: Physician Assistant

## 2015-08-03 VITALS — BP 130/78 | HR 76 | Temp 97.9°F | Resp 16 | Ht 65.0 in | Wt 205.0 lb

## 2015-08-03 DIAGNOSIS — J309 Allergic rhinitis, unspecified: Secondary | ICD-10-CM | POA: Diagnosis not present

## 2015-08-03 MED ORDER — MONTELUKAST SODIUM 10 MG PO TABS: 10.0000 mg | ORAL_TABLET | Freq: Every day | ORAL | Status: DC

## 2015-08-03 NOTE — Patient Instructions (Signed)
Use flonase in the morning. Take zyrtec and singulair at night before bed. If not getting better in 1 week, call me and I will call in an antibiotic.

## 2015-08-03 NOTE — Progress Notes (Signed)
[facility] [address] [place] [address] [phone]  Date:  08/03/2015   Name:  Martha Lara   DOB:  1941-05-12   MRN:  [id number]  PCP:  Wenda Low, MD    Chief Complaint: Follow-up   History of Present Illness:  This is a 75 y.o. female who is presenting to follow up. She was seen here 07/30/15 with complaint of nasal congestion, watery eyes, sneezing x 2 weeks. Thought to be allergy related. Pt states she has problems with her allergies 2-3 times a year. She takes zyrtec and flonase daily. Last viist she was also prescribed mucinex and atrovent. States her pharmacy did not have atrovent but did have mucinex which she thinks is helping some. States she feels fine but nasal congestion won't clear. She is coughing up drainage from her throat. No sore throat. No otalgia. No facial pain. No fever or chills. She has 2 cats. One for 8 years, one for 10 months. She does not think her cats bother her.  Review of Systems:  Review of Systems See HPI  There are no active problems to display for this patient.   Prior to Admission medications   Medication Sig Start Date End Date Taking? Authorizing Provider  benzonatate (TESSALON) 100 MG capsule Take 1-2 capsules (100-200 mg total) by mouth 3 (three) times daily as needed for cough. 05/09/15  Yes Tishira R Brewington, PA-C  cetirizine (ZYRTEC) 10 MG tablet Take 10 mg by mouth daily.   Yes Historical Provider, MD  fluticasone (FLONASE) 50 MCG/ACT nasal spray Place 2 sprays into both nostrils daily. 04/19/14  Yes Gerda Diss, DO  Guaifenesin [facility] MAXIMUM STRENGTH) 1200 MG TB12 Take 1 tablet (1,200 mg total) by mouth every 12 (twelve) hours as needed. 05/09/15  Yes Tishira R Brewington, PA-C  meclizine (ANTIVERT) 12.5 MG tablet Take 12.5 mg by mouth daily.    Yes Historical Provider, MD       Joretta Bachelor, PA       Nolene Bernheim, PA-C       Historical Provider, MD    No Known  Allergies  Past Surgical History  Procedure Laterality Date  . Cholecystectomy    . Tonsillectomy    . Fracture surgery      Social History  Substance Use Topics  . Smoking status: Former Research scientist (life sciences)  . Smokeless tobacco: Never Used  . Alcohol Use: No    Family History  Problem Relation Age of Onset  . Cancer Father     Medication list has been reviewed and updated.  Physical Examination:  Physical Exam  Constitutional: She is oriented to person, place, and time. She appears well-developed and well-nourished. No distress.  HENT:  Head: Normocephalic and atraumatic.  Right Ear: Hearing, external ear and ear canal normal. Tympanic membrane is retracted.  Left Ear: Hearing, external ear and ear canal normal. Tympanic membrane is retracted.  Nose: Mucosal edema present. Right sinus exhibits no maxillary sinus tenderness and no frontal sinus tenderness. Left sinus exhibits no maxillary sinus tenderness and no frontal sinus tenderness.  Mouth/Throat: Uvula is midline, oropharynx is clear and moist and mucous membranes are normal.  Eyes: Conjunctivae and lids are normal. Right eye exhibits no discharge. Left eye exhibits no discharge. No scleral icterus.  Cardiovascular: Normal rate, regular rhythm, normal heart sounds and normal pulses.   No murmur heard. Pulmonary/Chest: Effort normal and breath sounds normal. No respiratory distress. She has no wheezes. She has  no rhonchi. She has no rales.  Musculoskeletal: Normal range of motion.  Lymphadenopathy:       Head (right side): No submental, no submandibular and no tonsillar adenopathy present.       Head (left side): No submental, no submandibular and no tonsillar adenopathy present.    She has no cervical adenopathy.  Neurological: She is alert and oriented to person, place, and time.  Skin: Skin is warm, dry and intact. No lesion and no rash noted.  Psychiatric: She has a normal mood and affect. Her speech is normal and behavior is  normal. Thought content normal.    BP 130/78 mmHg  Pulse 76  Temp(Src) 97.9 F (36.6 C) (Oral)  Resp 16  Ht 5\' 5"  (1.651 m)  Wt 205 lb (92.987 kg)  BMI 34.11 kg/m2  SpO2 98%  Assessment and Plan:  1. Allergic rhinitis, unspecified allergic rhinitis type I do think symptoms are related to allergies. Will add singulair to zyrtec and flonase. If symptoms still not improving in 10 days or so, would consider treating with abx for sinus infection. She will call if not getting better. - montelukast (SINGULAIR) 10 MG tablet; Take 1 tablet (10 mg total) by mouth at bedtime.  Dispense: 30 tablet; Refill: 3   Nicole V. Drenda Freeze, MHS [facility]  08/03/2015

## 2015-10-28 ENCOUNTER — Ambulatory Visit (INDEPENDENT_AMBULATORY_CARE_PROVIDER_SITE_OTHER): Payer: Medicare Other | Admitting: Family Medicine

## 2015-10-28 VITALS — BP 154/92 | HR 80 | Temp 98.8°F | Resp 16 | Ht 65.0 in | Wt 204.0 lb

## 2015-10-28 DIAGNOSIS — J309 Allergic rhinitis, unspecified: Secondary | ICD-10-CM

## 2015-10-28 DIAGNOSIS — R05 Cough: Secondary | ICD-10-CM | POA: Diagnosis not present

## 2015-10-28 DIAGNOSIS — R059 Cough, unspecified: Secondary | ICD-10-CM

## 2015-10-28 MED ORDER — BENZONATATE 100 MG PO CAPS: 100.0000 mg | ORAL_CAPSULE | Freq: Three times a day (TID) | ORAL | Status: DC | PRN

## 2015-10-28 NOTE — Progress Notes (Signed)
Subjective:  By signing my name below, I, Moises Blood, attest that this documentation has been prepared under the direction and in the presence of Merri Ray, MD. Electronically Signed: Moises Blood, [place]. 10/28/2015 , 6:14 PM .  Patient was seen in Room 14 .   Patient ID: Martha Lara, female    DOB: 1941-03-29, 75 y.o.   MRN: [id number] Chief Complaint  Patient presents with  . URI  . Cough    nasal congestion and headache x 5 days   . ear pain   HPI Martha Lara is a 75 y.o. female H/o allergic rhinitis, meniere's disease and positional vertigo.   Patient states having sinus pressure with headache, fatigue and congestion that started 5 nights ago. She reports staying in a hotel room for a convention in [place]. The vent was over her head and blew on her when she went to sleep. She had coughing with hoarseness and congestion that's gradually worsened. Today, she has a headache with watery and burning eyes, and feeling more congested and fatigue. She usually takes zyrtec everyday but hasn't been taken it for about a week. She just restarted zyrtec today. She's been using atrovent once a day in the morning. She denies taking flonase and singulair. She denies any fever. She denies any changes from her Meniere's disease.   Patient Active Problem List   Diagnosis Date Noted  . Rhinitis, allergic 08/03/2015   History reviewed. No pertinent past medical history. Past Surgical History  Procedure Laterality Date  . Cholecystectomy    . Tonsillectomy    . Fracture surgery     No Known Allergies Prior to Admission medications   Medication Sig Start Date End Date Taking? Authorizing Provider  cetirizine (ZYRTEC) 10 MG tablet Take 10 mg by mouth daily.   Yes Historical Provider, MD  fluticasone (FLONASE) 50 MCG/ACT nasal spray Place 2 sprays into both nostrils daily. 04/19/14  Yes Gerda Diss, DO  LORazepam (ATIVAN) 1 MG tablet Take 1 mg by mouth 3 (three) times daily as  needed for anxiety. Reported on 08/03/2015   Yes Historical Provider, MD  meclizine (ANTIVERT) 12.5 MG tablet Take 12.5 mg by mouth daily.    Yes Historical Provider, MD  montelukast (SINGULAIR) 10 MG tablet Take 1 tablet (10 mg total) by mouth at bedtime. 08/03/15  Yes Ezekiel Slocumb, PA-C   Social History   Social History  . Marital Status: Married    Spouse Name: N/A  . Number of Children: N/A  . Years of Education: N/A   Occupational History  . Not on file.   Social History Main Topics  . Smoking status: Former Research scientist (life sciences)  . Smokeless tobacco: Never Used  . Alcohol Use: No  . Drug Use: No  . Sexual Activity: Not on file   Other Topics Concern  . Not on file   Social History Narrative   Review of Systems  Constitutional: Positive for fatigue. Negative for fever and chills.  HENT: Positive for congestion, ear pain, rhinorrhea, sinus pressure and voice change.   Eyes: Positive for discharge and itching.  Respiratory: Positive for cough.   Allergic/Immunologic: Positive for environmental allergies.  Neurological: Positive for headaches.      Objective:   Physical Exam  Constitutional: She is oriented to person, place, and time. She appears well-developed and well-nourished. No distress.  HENT:  Head: Normocephalic and atraumatic.  Right Ear: Tympanic membrane normal.  Left Ear: Tympanic membrane normal.  Nose: Mucosal edema  and rhinorrhea (boggy turbinates) present. Right sinus exhibits no maxillary sinus tenderness and no frontal sinus tenderness. Left sinus exhibits no maxillary sinus tenderness and no frontal sinus tenderness.  Eyes: EOM are normal. Pupils are equal, round, and reactive to light.  Neck: Neck supple.  Cardiovascular: Normal rate.   Pulmonary/Chest: Effort normal and breath sounds normal. No respiratory distress.  Musculoskeletal: Normal range of motion.  Lymphadenopathy:    She has no cervical adenopathy.  Neurological: She is alert and oriented to person,  place, and time.  Skin: Skin is warm and dry.  Psychiatric: She has a normal mood and affect. Her behavior is normal.  Nursing note and vitals reviewed.   Filed Vitals:   10/28/15 1720  BP: 154/92  Pulse: 80  Temp: 98.8 F (37.1 C)  TempSrc: Oral  Resp: 16  Height: 5\' 5"  (1.651 m)  Weight: 204 lb (92.534 kg)  SpO2: 96%      Assessment & Plan:   Martha Lara is a 75 y.o. female Allergic rhinitis, unspecified allergic rhinitis type  Cough - Plan: benzonatate (TESSALON) 100 MG capsule  Suspected flare of allergic rhinitis versus viral upper respiratory infection. No sinus tenderness on exam, afebrile, overall reassuring exam. Some increased headache today, but likely due to sinus pressure.  -Discussed saline nasal spray, restarting Flonase nasal spray, and continue Zyrtec that was restarted today.  -Mucinex or Tessalon is not too costly for cough.  -If headache persists this week, or not improving in 3-5 days, consider treatment for sinus infection, but currently appears to be more congestion. RTC precautions discussed.  Meds ordered this encounter  Medications  . benzonatate (TESSALON) 100 MG capsule    Sig: Take 1 capsule (100 mg total) by mouth 3 (three) times daily as needed for cough.    Dispense:  20 capsule    Refill:  0   Patient Instructions       IF you received an x-ray today, you will receive an invoice from [facility]. Please contact [facility] at [phone] with questions or concerns regarding your invoice.   IF you received labwork today, you will receive an invoice from [employer]. Please contact [facility] at [phone] with questions or concerns regarding your invoice.   Our billing staff will not be able to assist you with questions regarding bills from these companies.  You will be contacted with the lab results as soon as they are available. The fastest way to get your results is to activate your My  Chart account. Instructions are located on the last page of this paperwork. If you have not heard from [place] regarding the results in 2 weeks, please contact this office.     Your symptoms appear to be due to allergies or possible virus at this time. Recommend you restart Flonase nasal spray 2 sprays per nostril every day, and continue zyrtec 1 pill per day.   For cough, you can either use the Tessalon Perles that I prescribed, or Mucinex DM.  If allergy symptoms and headache are not improving in the next 3-5 days, or any worsening sooner including any fevers or shortness of breath, recommend you return here or other medical provider for evaluation.  Return to the clinic or go to the nearest emergency room if any of your symptoms worsen or new symptoms occur.   Cough, Adult Coughing is a reflex that clears your throat and your airways. Coughing helps to heal and protect your lungs. It is normal  to cough occasionally, but a cough that happens with other symptoms or lasts a long time may be a sign of a condition that needs treatment. A cough may last only 2-3 weeks (acute), or it may last longer than 8 weeks (chronic). CAUSES Coughing is commonly caused by:  Breathing in substances that irritate your lungs.  A viral or bacterial respiratory infection.  Allergies.  Asthma.  Postnasal drip.  Smoking.  Acid backing up from the stomach into the esophagus (gastroesophageal reflux).  Certain medicines.  Chronic lung problems, including COPD (or rarely, lung cancer).  Other medical conditions such as heart failure. HOME CARE INSTRUCTIONS  Pay attention to any changes in your symptoms. Take these actions to help with your discomfort:  Take medicines only as told by your health care provider.  If you were prescribed an antibiotic medicine, take it as told by your health care provider. Do not stop taking the antibiotic even if you start to feel better.  Talk with your health care  provider before you take a cough suppressant medicine.  Drink enough fluid to keep your urine clear or pale yellow.  If the air is dry, use a cold steam vaporizer or humidifier in your bedroom or your home to help loosen secretions.  Avoid anything that causes you to cough at work or at home.  If your cough is worse at night, try sleeping in a semi-upright position.  Avoid cigarette smoke. If you smoke, quit smoking. If you need help quitting, ask your health care provider.  Avoid caffeine.  Avoid alcohol.  Rest as needed. SEEK MEDICAL CARE IF:   You have new symptoms.  You cough up pus.  Your cough does not get better after 2-3 weeks, or your cough gets worse.  You cannot control your cough with suppressant medicines and you are losing sleep.  You develop pain that is getting worse or pain that is not controlled with pain medicines.  You have a fever.  You have unexplained weight loss.  You have night sweats. SEEK IMMEDIATE MEDICAL CARE IF:  You cough up blood.  You have difficulty breathing.  Your heartbeat is very fast.   This information is not intended to replace advice given to you by your health care provider. Make sure you discuss any questions you have with your health care provider.   Document Released: 12/12/2010 Document Revised: 03/06/2015 Document Reviewed: 08/22/2014 Elsevier Interactive Patient Education 2016 [employer]. Allergic Rhinitis Allergic rhinitis is when the mucous membranes in the nose respond to allergens. Allergens are particles in the air that cause your body to have an allergic reaction. This causes you to release allergic antibodies. Through a chain of events, these eventually cause you to release histamine into the blood stream. Although meant to protect the body, it is this release of histamine that causes your discomfort, such as frequent sneezing, congestion, and an itchy, runny nose.  CAUSES Seasonal allergic rhinitis (hay  fever) is caused by pollen allergens that may come from grasses, trees, and weeds. Year-round allergic rhinitis (perennial allergic rhinitis) is caused by allergens such as house dust mites, pet dander, and mold spores. SYMPTOMS  Nasal stuffiness (congestion).  Itchy, runny nose with sneezing and tearing of the eyes. DIAGNOSIS Your health care provider can help you determine the allergen or allergens that trigger your symptoms. If you and your health care provider are unable to determine the allergen, skin or blood testing may be used. Your health care provider will diagnose your  condition after taking your health history and performing a physical exam. Your health care provider may assess you for other related conditions, such as asthma, pink eye, or an ear infection. TREATMENT Allergic rhinitis does not have a cure, but it can be controlled by:  Medicines that block allergy symptoms. These may include allergy shots, nasal sprays, and oral antihistamines.  Avoiding the allergen. Hay fever may often be treated with antihistamines in pill or nasal spray forms. Antihistamines block the effects of histamine. There are over-the-counter medicines that may help with nasal congestion and swelling around the eyes. Check with your health care provider before taking or giving this medicine. If avoiding the allergen or the medicine prescribed do not work, there are many new medicines your health care provider can prescribe. Stronger medicine may be used if initial measures are ineffective. Desensitizing injections can be used if medicine and avoidance does not work. Desensitization is when a patient is given ongoing shots until the body becomes less sensitive to the allergen. Make sure you follow up with your health care provider if problems continue. HOME CARE INSTRUCTIONS It is not possible to completely avoid allergens, but you can reduce your symptoms by taking steps to limit your exposure to them. It  helps to know exactly what you are allergic to so that you can avoid your specific triggers. SEEK MEDICAL CARE IF:  You have a fever.  You develop a cough that does not stop easily (persistent).  You have shortness of breath.  You start wheezing.  Symptoms interfere with normal daily activities.   This information is not intended to replace advice given to you by your health care provider. Make sure you discuss any questions you have with your health care provider.   Document Released: 03/10/2001 Document Revised: 07/06/2014 Document Reviewed: 02/20/2013 Elsevier Interactive Patient Education [employer].     I personally performed the services described in this documentation, which was scribed in my presence. The recorded information has been reviewed and considered, and addended by me as needed.

## 2015-10-28 NOTE — Patient Instructions (Addendum)
IF you received an x-ray today, you will receive an invoice from [facility]. Please contact [facility] at [phone] with questions or concerns regarding your invoice.   IF you received labwork today, you will receive an invoice from [employer]. Please contact [facility] at [phone] with questions or concerns regarding your invoice.   Our billing staff will not be able to assist you with questions regarding bills from these companies.  You will be contacted with the lab results as soon as they are available. The fastest way to get your results is to activate your My Chart account. Instructions are located on the last page of this paperwork. If you have not heard from [place] regarding the results in 2 weeks, please contact this office.     Your symptoms appear to be due to allergies or possible virus at this time. Recommend you restart Flonase nasal spray 2 sprays per nostril every day, and continue zyrtec 1 pill per day.   For cough, you can either use the Tessalon Perles that I prescribed, or Mucinex DM.  If allergy symptoms and headache are not improving in the next 3-5 days, or any worsening sooner including any fevers or shortness of breath, recommend you return here or other medical provider for evaluation.  Return to the clinic or go to the nearest emergency room if any of your symptoms worsen or new symptoms occur.   Cough, Adult Coughing is a reflex that clears your throat and your airways. Coughing helps to heal and protect your lungs. It is normal to cough occasionally, but a cough that happens with other symptoms or lasts a long time may be a sign of a condition that needs treatment. A cough may last only 2-3 weeks (acute), or it may last longer than 8 weeks (chronic). CAUSES Coughing is commonly caused by:  Breathing in substances that irritate your lungs.  A viral or bacterial respiratory  infection.  Allergies.  Asthma.  Postnasal drip.  Smoking.  Acid backing up from the stomach into the esophagus (gastroesophageal reflux).  Certain medicines.  Chronic lung problems, including COPD (or rarely, lung cancer).  Other medical conditions such as heart failure. HOME CARE INSTRUCTIONS  Pay attention to any changes in your symptoms. Take these actions to help with your discomfort:  Take medicines only as told by your health care provider.  If you were prescribed an antibiotic medicine, take it as told by your health care provider. Do not stop taking the antibiotic even if you start to feel better.  Talk with your health care provider before you take a cough suppressant medicine.  Drink enough fluid to keep your urine clear or pale yellow.  If the air is dry, use a cold steam vaporizer or humidifier in your bedroom or your home to help loosen secretions.  Avoid anything that causes you to cough at work or at home.  If your cough is worse at night, try sleeping in a semi-upright position.  Avoid cigarette smoke. If you smoke, quit smoking. If you need help quitting, ask your health care provider.  Avoid caffeine.  Avoid alcohol.  Rest as needed. SEEK MEDICAL CARE IF:   You have new symptoms.  You cough up pus.  Your cough does not get better after 2-3 weeks, or your cough gets worse.  You cannot control your cough with suppressant medicines and you are losing sleep.  You develop pain that is getting worse or pain that is  not controlled with pain medicines.  You have a fever.  You have unexplained weight loss.  You have night sweats. SEEK IMMEDIATE MEDICAL CARE IF:  You cough up blood.  You have difficulty breathing.  Your heartbeat is very fast.   This information is not intended to replace advice given to you by your health care provider. Make sure you discuss any questions you have with your health care provider.   Document Released:  12/12/2010 Document Revised: 03/06/2015 Document Reviewed: 08/22/2014 Elsevier Interactive Patient Education 2016 [employer]. Allergic Rhinitis Allergic rhinitis is when the mucous membranes in the nose respond to allergens. Allergens are particles in the air that cause your body to have an allergic reaction. This causes you to release allergic antibodies. Through a chain of events, these eventually cause you to release histamine into the blood stream. Although meant to protect the body, it is this release of histamine that causes your discomfort, such as frequent sneezing, congestion, and an itchy, runny nose.  CAUSES Seasonal allergic rhinitis (hay fever) is caused by pollen allergens that may come from grasses, trees, and weeds. Year-round allergic rhinitis (perennial allergic rhinitis) is caused by allergens such as house dust mites, pet dander, and mold spores. SYMPTOMS  Nasal stuffiness (congestion).  Itchy, runny nose with sneezing and tearing of the eyes. DIAGNOSIS Your health care provider can help you determine the allergen or allergens that trigger your symptoms. If you and your health care provider are unable to determine the allergen, skin or blood testing may be used. Your health care provider will diagnose your condition after taking your health history and performing a physical exam. Your health care provider may assess you for other related conditions, such as asthma, pink eye, or an ear infection. TREATMENT Allergic rhinitis does not have a cure, but it can be controlled by:  Medicines that block allergy symptoms. These may include allergy shots, nasal sprays, and oral antihistamines.  Avoiding the allergen. Hay fever may often be treated with antihistamines in pill or nasal spray forms. Antihistamines block the effects of histamine. There are over-the-counter medicines that may help with nasal congestion and swelling around the eyes. Check with your health care provider  before taking or giving this medicine. If avoiding the allergen or the medicine prescribed do not work, there are many new medicines your health care provider can prescribe. Stronger medicine may be used if initial measures are ineffective. Desensitizing injections can be used if medicine and avoidance does not work. Desensitization is when a patient is given ongoing shots until the body becomes less sensitive to the allergen. Make sure you follow up with your health care provider if problems continue. HOME CARE INSTRUCTIONS It is not possible to completely avoid allergens, but you can reduce your symptoms by taking steps to limit your exposure to them. It helps to know exactly what you are allergic to so that you can avoid your specific triggers. SEEK MEDICAL CARE IF:  You have a fever.  You develop a cough that does not stop easily (persistent).  You have shortness of breath.  You start wheezing.  Symptoms interfere with normal daily activities.   This information is not intended to replace advice given to you by your health care provider. Make sure you discuss any questions you have with your health care provider.   Document Released: 03/10/2001 Document Revised: 07/06/2014 Document Reviewed: 02/20/2013 Elsevier Interactive Patient Education [employer].

## 2016-04-09 ENCOUNTER — Other Ambulatory Visit: Payer: Self-pay | Admitting: Internal Medicine

## 2016-04-09 DIAGNOSIS — Z1231 Encounter for screening mammogram for malignant neoplasm of breast: Secondary | ICD-10-CM

## 2016-04-23 ENCOUNTER — Ambulatory Visit
Admission: RE | Admit: 2016-04-23 | Discharge: 2016-04-23 | Disposition: A | Discharge status: Home / Self Care | Payer: Medicare Other | Source: Ambulatory Visit | Attending: Internal Medicine | Admitting: Internal Medicine

## 2016-04-23 DIAGNOSIS — Z1231 Encounter for screening mammogram for malignant neoplasm of breast: Secondary | ICD-10-CM

## 2016-04-27 ENCOUNTER — Other Ambulatory Visit: Payer: Self-pay | Admitting: Internal Medicine

## 2016-04-27 DIAGNOSIS — R928 Other abnormal and inconclusive findings on diagnostic imaging of breast: Secondary | ICD-10-CM

## 2016-04-29 ENCOUNTER — Ambulatory Visit
Admission: RE | Admit: 2016-04-29 | Discharge: 2016-04-29 | Disposition: A | Discharge status: Home / Self Care | Payer: Medicare Other | Source: Ambulatory Visit | Attending: Internal Medicine | Admitting: Internal Medicine

## 2016-04-29 DIAGNOSIS — R928 Other abnormal and inconclusive findings on diagnostic imaging of breast: Secondary | ICD-10-CM

## 2016-06-27 ENCOUNTER — Encounter (HOSPITAL_COMMUNITY): Payer: Self-pay

## 2016-06-27 DIAGNOSIS — Z79899 Other long term (current) drug therapy: Secondary | ICD-10-CM | POA: Diagnosis not present

## 2016-06-27 DIAGNOSIS — Z87891 Personal history of nicotine dependence: Secondary | ICD-10-CM | POA: Insufficient documentation

## 2016-06-27 DIAGNOSIS — R42 Dizziness and giddiness: Secondary | ICD-10-CM | POA: Insufficient documentation

## 2016-06-27 LAB — COMPREHENSIVE METABOLIC PANEL
ALT: 26 U/L (ref 14–54)
ANION GAP: 9 (ref 5–15)
AST: 25 U/L (ref 15–41)
Albumin: 4 g/dL (ref 3.5–5.0)
Alkaline Phosphatase: 83 U/L (ref 38–126)
BUN: 11 mg/dL (ref 6–20)
CO2: 23 mmol/L (ref 22–32)
Calcium: 8.7 mg/dL — ABNORMAL LOW (ref 8.9–10.3)
Chloride: 105 mmol/L (ref 101–111)
Creatinine, Ser: 0.66 mg/dL (ref 0.44–1.00)
GFR calc Af Amer: >60 mL/min (ref >60)
GFR calc non Af Amer: >60 mL/min (ref >60)
GLUCOSE: 119 mg/dL — AB (ref 65–99)
POTASSIUM: 3.6 mmol/L (ref 3.5–5.1)
SODIUM: 137 mmol/L (ref 135–145)
Total Bilirubin: 0.4 mg/dL (ref 0.3–1.2)
Total Protein: 7 g/dL (ref 6.5–8.1)

## 2016-06-27 LAB — CBC
HEMATOCRIT: 38.5 % (ref 36.0–46.0)
HEMOGLOBIN: 12.8 g/dL (ref 12.0–15.0)
MCH: 31.3 pg (ref 26.0–34.0)
MCHC: 33.2 g/dL (ref 30.0–36.0)
MCV: 94.1 fL (ref 78.0–100.0)
Platelets: 252 10*3/uL (ref 150–400)
RBC: 4.09 MIL/uL (ref 3.87–5.11)
RDW: 12.9 % (ref 11.5–15.5)
WBC: 4.5 10*3/uL (ref 4.0–10.5)

## 2016-06-27 LAB — LIPASE, BLOOD: Lipase: 26 U/L (ref 11–51)

## 2016-06-27 MED ORDER — ONDANSETRON 4 MG PO TBDP
4.0000 mg | ORAL_TABLET | Freq: Once | ORAL | Status: AC | PRN
  Administered 2016-06-27: 4 mg via ORAL
  Filled 2016-06-27: qty 1

## 2016-06-27 NOTE — ED Triage Notes (Signed)
PT C/O VERTIGO WITH N/V/D, AND SWEATING. PT STS SHE HAS A HX OF VERTIGO AND GETS THESE SPELLS. PT DENIES INJURY OR FEVER.

## 2016-06-28 ENCOUNTER — Emergency Department (HOSPITAL_COMMUNITY)
Admission: EM | Admit: 2016-06-28 | Discharge: 2016-06-28 | Disposition: A | Discharge status: Home / Self Care | Payer: Medicare Other | Attending: Emergency Medicine | Admitting: Emergency Medicine

## 2016-06-28 ENCOUNTER — Emergency Department (HOSPITAL_COMMUNITY): Payer: Medicare Other

## 2016-06-28 DIAGNOSIS — R42 Dizziness and giddiness: Secondary | ICD-10-CM

## 2016-06-28 HISTORY — DX: Dizziness and giddiness: R42

## 2016-06-28 HISTORY — DX: Meniere's disease, unspecified ear: H81.09

## 2016-06-28 LAB — URINALYSIS, ROUTINE W REFLEX MICROSCOPIC
Bacteria, UA: NONE SEEN
Bilirubin Urine: NEGATIVE
Glucose, UA: NEGATIVE mg/dL
Ketones, ur: 5 mg/dL — AB
Leukocytes, UA: NEGATIVE
Nitrite: NEGATIVE
Protein, ur: NEGATIVE mg/dL
SPECIFIC GRAVITY, URINE: 1.017 (ref 1.005–1.030)
pH: 5 (ref 5.0–8.0)

## 2016-06-28 MED ORDER — MECLIZINE HCL 25 MG PO TABS
50.0000 mg | ORAL_TABLET | Freq: Once | ORAL | Status: AC
  Administered 2016-06-28: 50 mg via ORAL
  Filled 2016-06-28: qty 2

## 2016-06-28 NOTE — ED Provider Notes (Signed)
[facility] DEPT Provider Note    By signing my name below, I, Bea Graff, attest that this documentation has been prepared under the direction and in the presence of Everlene Balls, MD. Electronically Signed: Bea Graff, ED Scribe. 06/28/16. 2:47 AM.    History   Chief Complaint Chief Complaint  Patient presents with  . Dizziness  . Diarrhea   HPI  HPI Comments:  Martha Lara is a 75 y.o. female with PMHx of Meniere's Disease who presents to the Emergency Department complaining of improving dizziness that began approximately six hours ago. Pt reports associated diaphoresis, nausea, vomiting and diarrhea. She states she has been having a productive cough of phlegm for the past week that she reports taking NyQuil for with minimal relief. She states she was sitting down getting her hair done when the symptoms presented. Pt reports having similar symptoms in the past. She states she took her prescribed Meclizine but vomited it up so it was not helpful. She denies modifying symptoms. She denies fever, chills, urinary symptoms. She denies h/o CVA.   Past Medical History:  Diagnosis Date  . Meniere's disease   . Vertigo     Patient Active Problem List   Diagnosis Date Noted  . Rhinitis, allergic 08/03/2015    Past Surgical History:  Procedure Laterality Date  . CHOLECYSTECTOMY    . FRACTURE SURGERY    . TONSILLECTOMY      OB History    No data available       Home Medications    Prior to Admission medications   Medication Sig Start Date End Date Taking? Authorizing Provider  benzonatate (TESSALON) 100 MG capsule Take 1 capsule (100 mg total) by mouth 3 (three) times daily as needed for cough. 10/28/15   Wendie Agreste, MD  cetirizine (ZYRTEC) 10 MG tablet Take 10 mg by mouth daily.    Historical Provider, MD  fluticasone (FLONASE) 50 MCG/ACT nasal spray Place 2 sprays into both nostrils daily. 04/19/14   Gerda Diss, DO  LORazepam (ATIVAN) 1 MG tablet  Take 1 mg by mouth 3 (three) times daily as needed for anxiety. Reported on 08/03/2015    Historical Provider, MD  meclizine (ANTIVERT) 12.5 MG tablet Take 12.5 mg by mouth daily.     Historical Provider, MD  montelukast (SINGULAIR) 10 MG tablet Take 1 tablet (10 mg total) by mouth at bedtime. 08/03/15   Ezekiel Slocumb, PA-C    Family History Family History  Problem Relation Age of Onset  . Cancer Father     Social History Social History  Substance Use Topics  . Smoking status: Former Research scientist (life sciences)  . Smokeless tobacco: Never Used  . Alcohol use No     Allergies   Patient has no known allergies.   Review of Systems Review of Systems A complete 10 system review of systems was obtained and all systems are negative except as noted in the HPI and PMH.    Physical Exam Updated Vital Signs BP 162/90 (BP Location: Left Arm)   Pulse 75   Temp 97.6 F (36.4 C) (Oral)   Resp 16   Ht 5\' 5"  (1.651 m)   Wt 202 lb (91.6 kg)   SpO2 98%   BMI 33.61 kg/m   Physical Exam  Constitutional: She is oriented to person, place, and time. She appears well-developed and well-nourished. No distress.  HENT:  Head: Normocephalic and atraumatic.  Nose: Nose normal.  Mouth/Throat: Oropharynx is clear and moist. No oropharyngeal  exudate.  Eyes: Conjunctivae and EOM are normal. Pupils are equal, round, and reactive to light. No scleral icterus.  Neck: Normal range of motion. Neck supple. No JVD present. No tracheal deviation present. No thyromegaly present.  Cardiovascular: Normal rate, regular rhythm and normal heart sounds.  Exam reveals no gallop and no friction rub.   No murmur heard. Pulmonary/Chest: Effort normal and breath sounds normal. No respiratory distress. She has no wheezes. She exhibits no tenderness.  Abdominal: Soft. Bowel sounds are normal. She exhibits no distension and no mass. There is no tenderness. There is no rebound and no guarding.  Musculoskeletal: Normal range of motion. She  exhibits edema. She exhibits no tenderness.  Trace edema.  Lymphadenopathy:    She has no cervical adenopathy.  Neurological: She is alert and oriented to person, place, and time. No cranial nerve deficit. She exhibits normal muscle tone.  Normal strength and sensation in all extremities. Normal cerebellar testing.  Skin: Skin is warm and dry. No rash noted. No erythema. No pallor.  Nursing note and vitals reviewed.    ED Treatments / Results  DIAGNOSTIC STUDIES: Oxygen Saturation is 98% on RA, normal by my interpretation.   COORDINATION OF CARE: 1:15 AM- Will order labs, CXR, urinalysis and Zofran. Pt verbalizes understanding and agrees to plan.  Medications  ondansetron (ZOFRAN-ODT) disintegrating tablet 4 mg (4 mg Oral Given 06/27/16 2107)  meclizine (ANTIVERT) tablet 50 mg (50 mg Oral Given 06/28/16 0155)   Labs (all labs ordered are listed, but only abnormal results are displayed) Labs Reviewed  COMPREHENSIVE METABOLIC PANEL - Abnormal; Notable for the following:       Result Value   Glucose, Bld 119 (*)    Calcium 8.7 (*)    All other components within normal limits  URINALYSIS, ROUTINE W REFLEX MICROSCOPIC - Abnormal; Notable for the following:    Hgb urine dipstick SMALL (*)    Ketones, ur 5 (*)    Squamous Epithelial / LPF 0-5 (*)    All other components within normal limits  URINE CULTURE  LIPASE, BLOOD  CBC    EKG  EKG Interpretation None       Radiology Dg Chest 2 View  Result Date: 06/28/2016 CLINICAL DATA:  Improving dizziness began 6 hours ago, diaphoresis, nausea, vomiting and diarrhea. EXAM: CHEST  2 VIEW COMPARISON:  Chest radiograph April 27, 2014. FINDINGS: Cardiac silhouette is normal in size. Tortuous aorta associated with chronic hypertension. No pleural effusion or focal consolidation. No pneumothorax. Surgical clips in the included right abdomen compatible with cholecystectomy. Small probable hiatal hernia. IMPRESSION: No acute  cardiopulmonary process. Electronically Signed   By: Elon Alas M.D.   On: 06/28/2016 02:24    Procedures Procedures (including critical care time)  Medications Ordered in ED Medications  ondansetron (ZOFRAN-ODT) disintegrating tablet 4 mg (4 mg Oral Given 06/27/16 2107)  meclizine (ANTIVERT) tablet 50 mg (50 mg Oral Given 06/28/16 0155)     Initial Impression / Assessment and Plan / ED Course  I have reviewed the triage vital signs and the nursing notes.  Pertinent labs & imaging results that were available during my care of the patient were reviewed by me and considered in my medical decision making (see chart for details).  Clinical Course     Patient presents to the ED for dizziness, N/V/D.  She states these are her typical symptoms when her vertigo acts up.  After zofran she felt better and was able to tolerate meclizine.  This  fixed her dizziness in the ED.  She has medications at home. PCP fu advised within 3 days. I doubt stroke or central process as she is very familiar with her symptoms and has presented with this in the past.  Dizziness was not aburpt and is not persistent.  She appears well an din AND.  Vs remain withi her normal limits and she is safe for DC.   I personally performed the services described in this documentation, which was scribed in my presence. The recorded information has been reviewed and is accurate.     Final Clinical Impressions(s) / ED Diagnoses   Final diagnoses:  None    New Prescriptions New Prescriptions   No medications on file     Everlene Balls, MD 06/28/16 [phone]

## 2016-06-29 LAB — URINE CULTURE

## 2016-09-16 IMAGING — MR MR HEAD W/O CM
8 of 10 series · 37 of 48 positions shown · non-contrast
Comparison: CT of the head April 27, 2014 at 7272 hr

CLINICAL DATA: Nausea, vomiting, vertigo beginning today after new
medication.

EXAM:
MRI HEAD WITHOUT CONTRAST
TECHNIQUE: Multiplanar, multiecho pulse sequences of the brain and surrounding
structures were obtained without intravenous contrast.

[Series 3: T1 · sagittal · 5.0mm · 0.47mm/px · 1 of 26 slices shown]
[im 1/26]
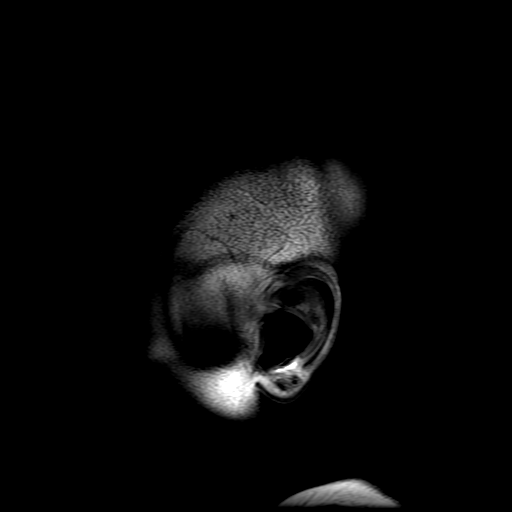

[Series 4: DWI · axial · 5.0mm · 1.09mm/px · z∈[-65,+79]mm · 8 of 60 slices shown (1 of 4)]
[im 1/60]
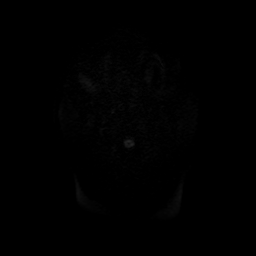
[im 9/60]
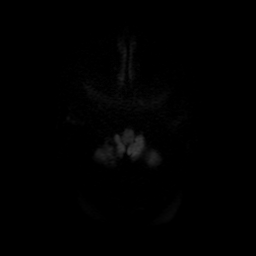
[im 17/60]
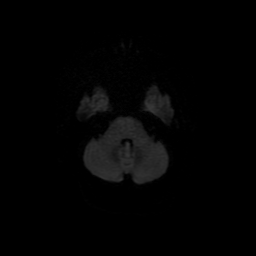
[im 26/60]
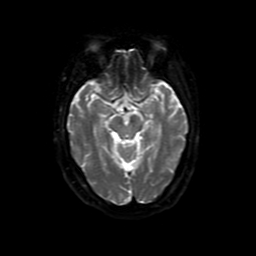
[im 34/60]
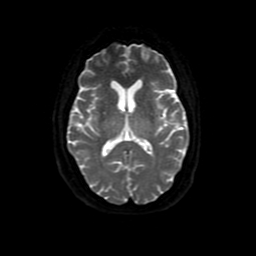
[im 43/60]
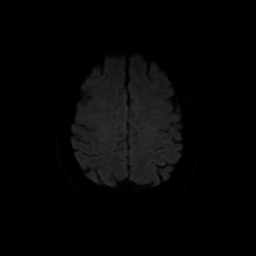
[im 51/60]
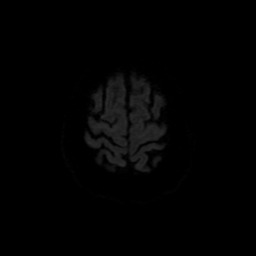
[im 60/60]
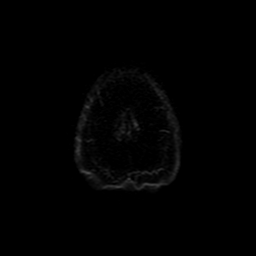

[Series 5: DWI · coronal · 5.0mm · 1.09mm/px · 9 of 72 slices shown (2 of 4)]
[im 1/72]
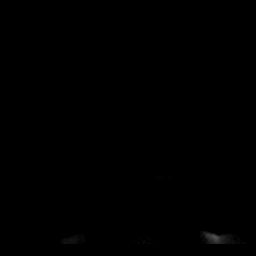
[im 9/72]
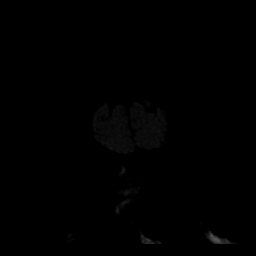
[im 18/72]
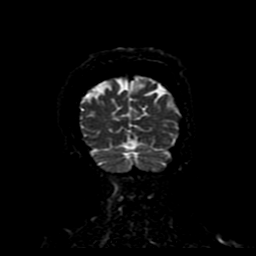
[im 27/72]
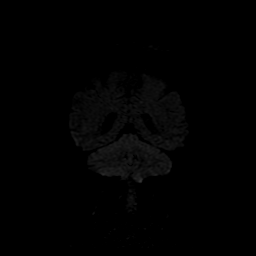
[im 36/72]
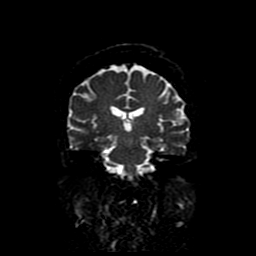
[im 45/72]
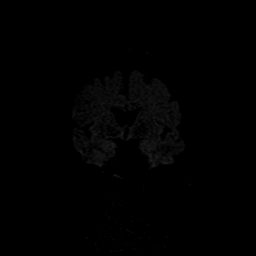
[im 54/72]
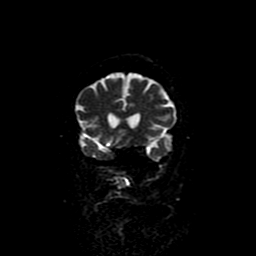
[im 63/72]
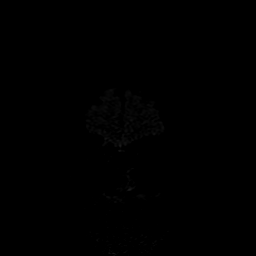
[im 72/72]
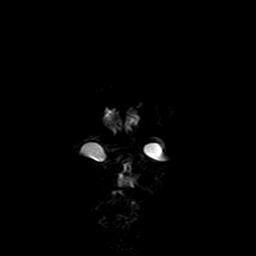

[Series 6: T2 · axial · 5.0mm · 0.45mm/px · z∈[-52,+84]mm · 3 of 22 slices shown]
[im 1/22]
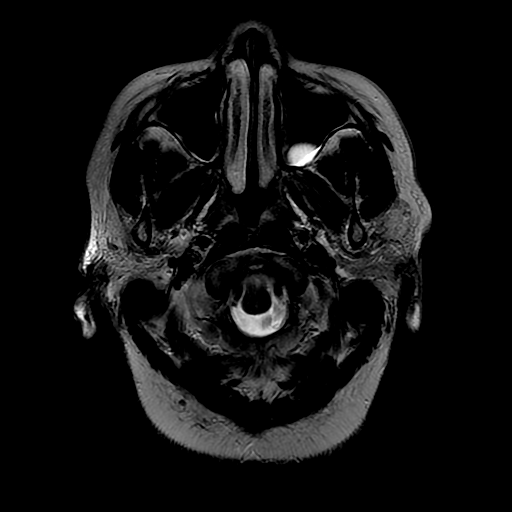
[im 11/22]
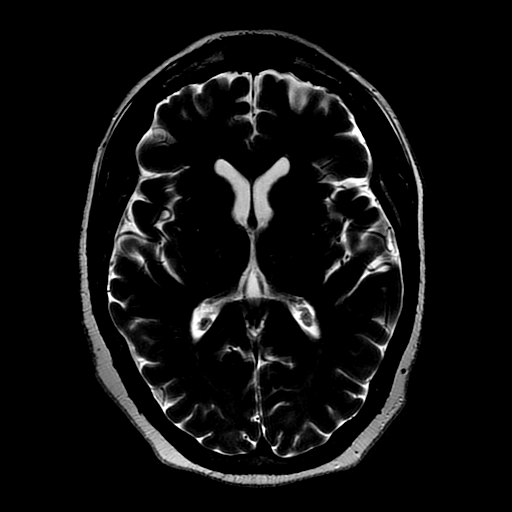
[im 22/22]
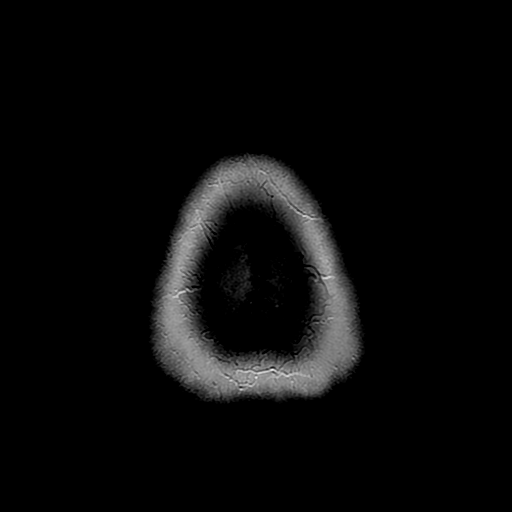

[Series 7: FLAIR · axial · 5.0mm · 0.45mm/px · z∈[-52,+84]mm · 3 of 22 slices shown]
[im 1/22]
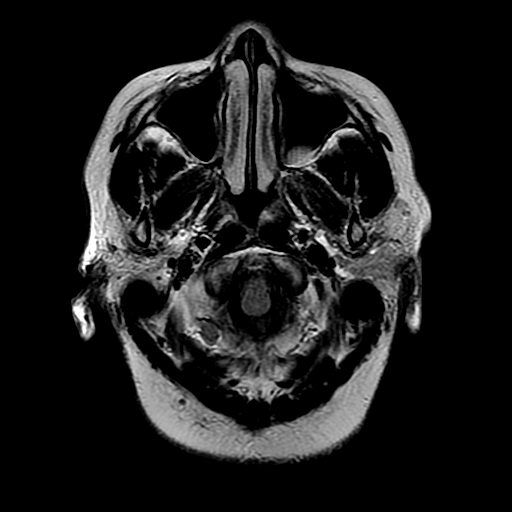
[im 11/22]
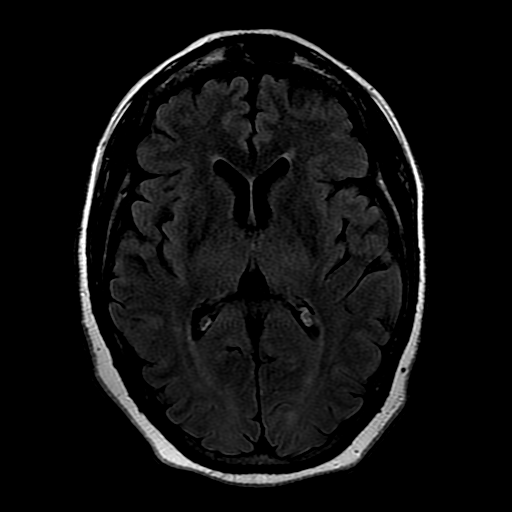
[im 22/22]
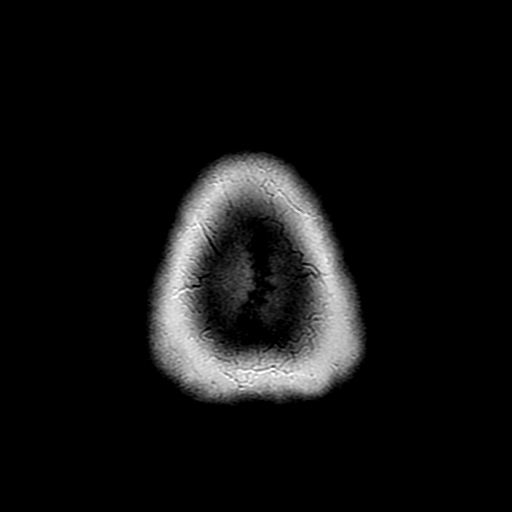

[Series 10: T2 post-contrast · coronal · 5.0mm · 0.45mm/px · 4 of 30 slices shown]
[im 1/30]
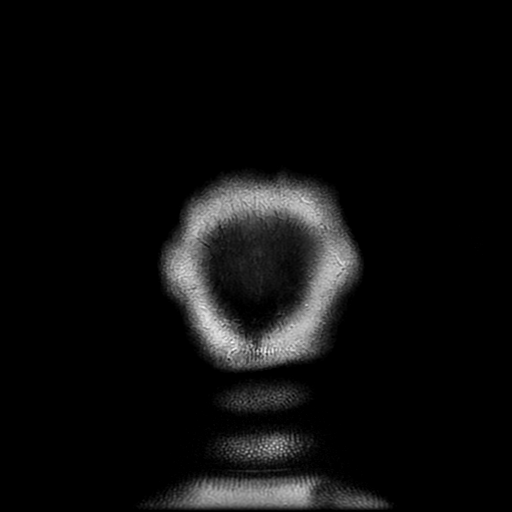
[im 10/30]
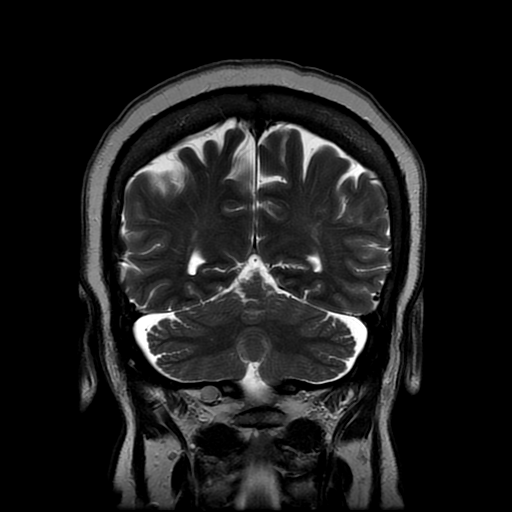
[im 20/30]
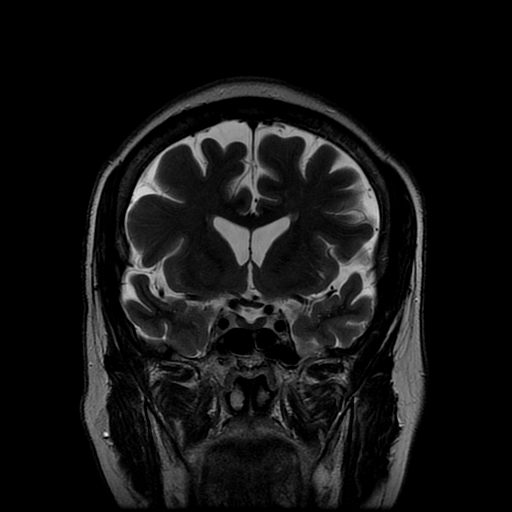
[im 30/30]
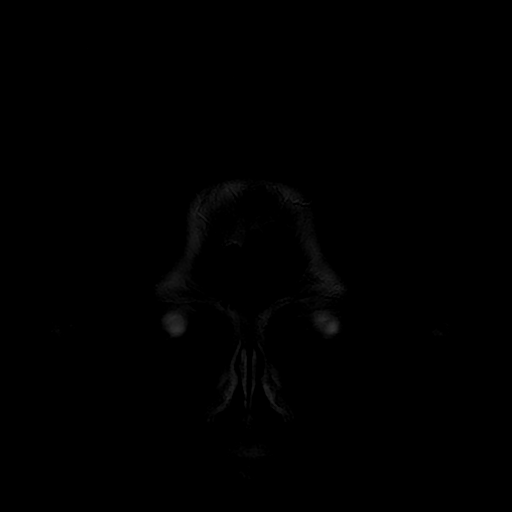

[Series 400: DWI · axial · 5.0mm · 1.09mm/px · z∈[-65,+79]mm · 4 of 30 slices shown (3 of 4)]
[im 1/30]
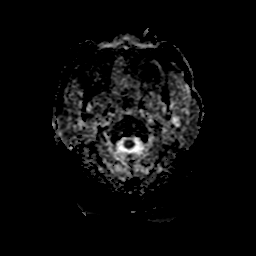
[im 10/30]
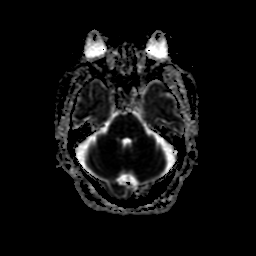
[im 20/30]
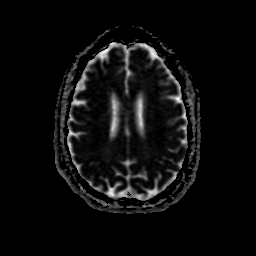
[im 30/30]
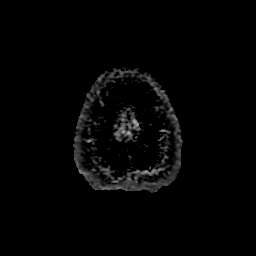

[Series 500: DWI · coronal · 5.0mm · 1.09mm/px · 5 of 36 slices shown (4 of 4)]
[im 1/36]
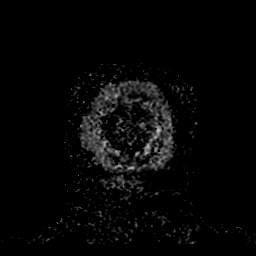
[im 9/36]
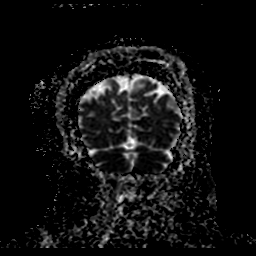
[im 18/36]
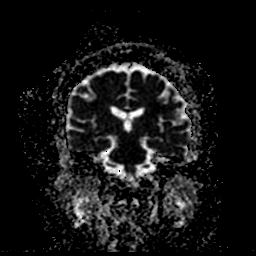
[im 27/36]
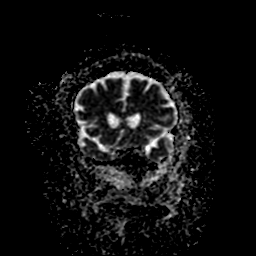
[im 36/36]
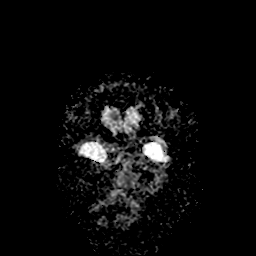

[37 of 48 positions shown; findings below may reference images not displayed]

FINDINGS: No reduced diffusion to suggest acute ischemia. No susceptibility
artifact is shift of hemorrhage. The ventricles and sulci are normal
for patient's age. Minimal white matter changes suggest chronic
small vessel ischemic disease, less than expected for age. 3 mm T2
hyperintensity within the mesial LEFT thalamus could reflect remote
lacunar infarct or, perivascular space. No midline shift or mass
effect.

No abnormal extra-axial fluid collections. Normal major intracranial
vascular flow voids seen at the skull base.

Ocular globes and orbital contents are unremarkable. Trace paranasal
sinus mucosal thickening with a small LEFT maxillary mucosal
retention cyst. Minimal fluid signal in LEFT mastoid tip.

No abnormal sellar expansion. No cerebellar tonsillar ectopia. No
suspicious calvarial bone marrow signal.
IMPRESSION: No acute intracranial process; normal noncontrast MRI of the brain
for age.

  By: Abhilash Lopez Lopez

## 2016-09-16 IMAGING — CR DG CHEST 2V
2 series · 2 of 2 positions shown · non-contrast
Comparison: None.

CLINICAL DATA: Dizziness and nausea for 1 day.

EXAM:
CHEST  2 VIEW

[w chest lat]
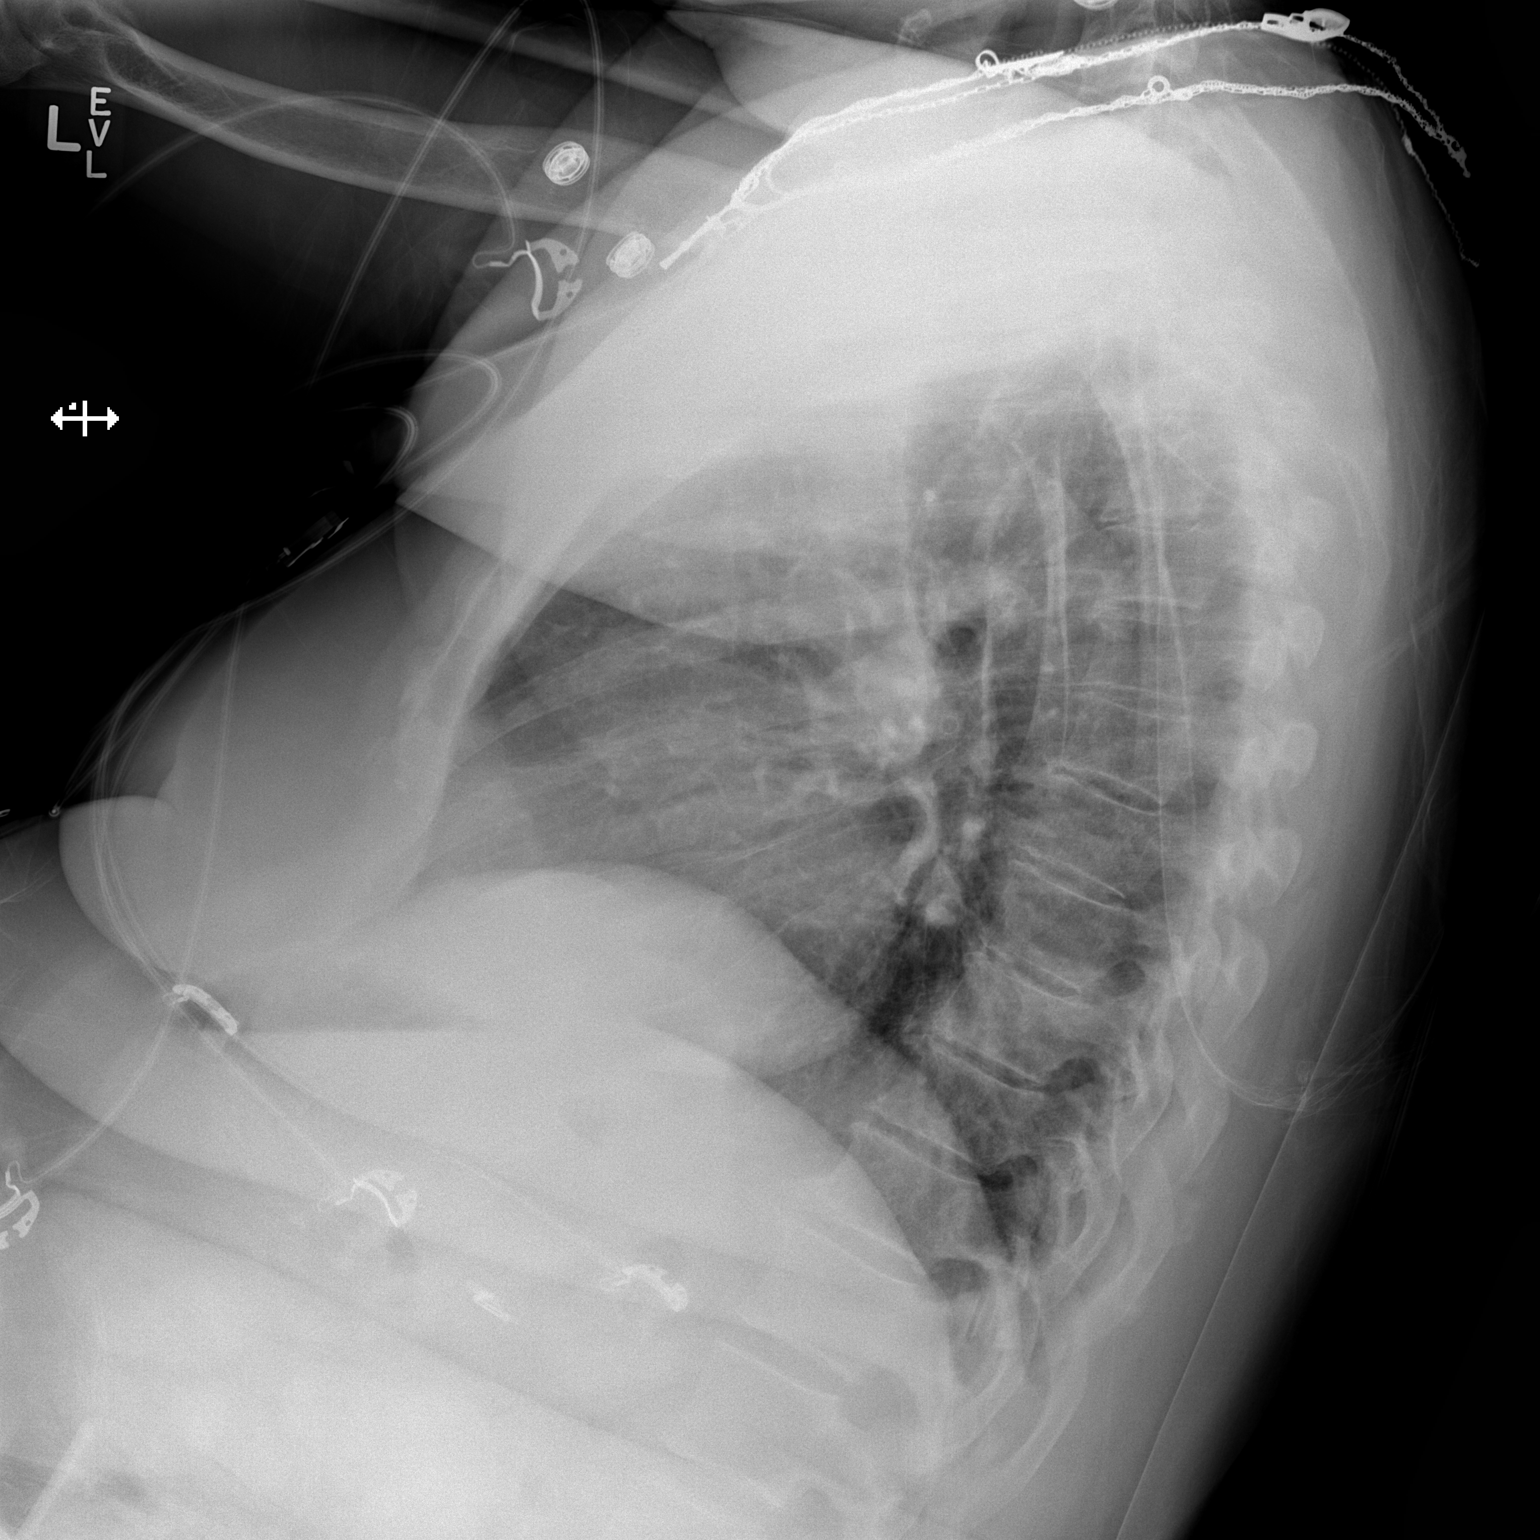

[x chest ap]
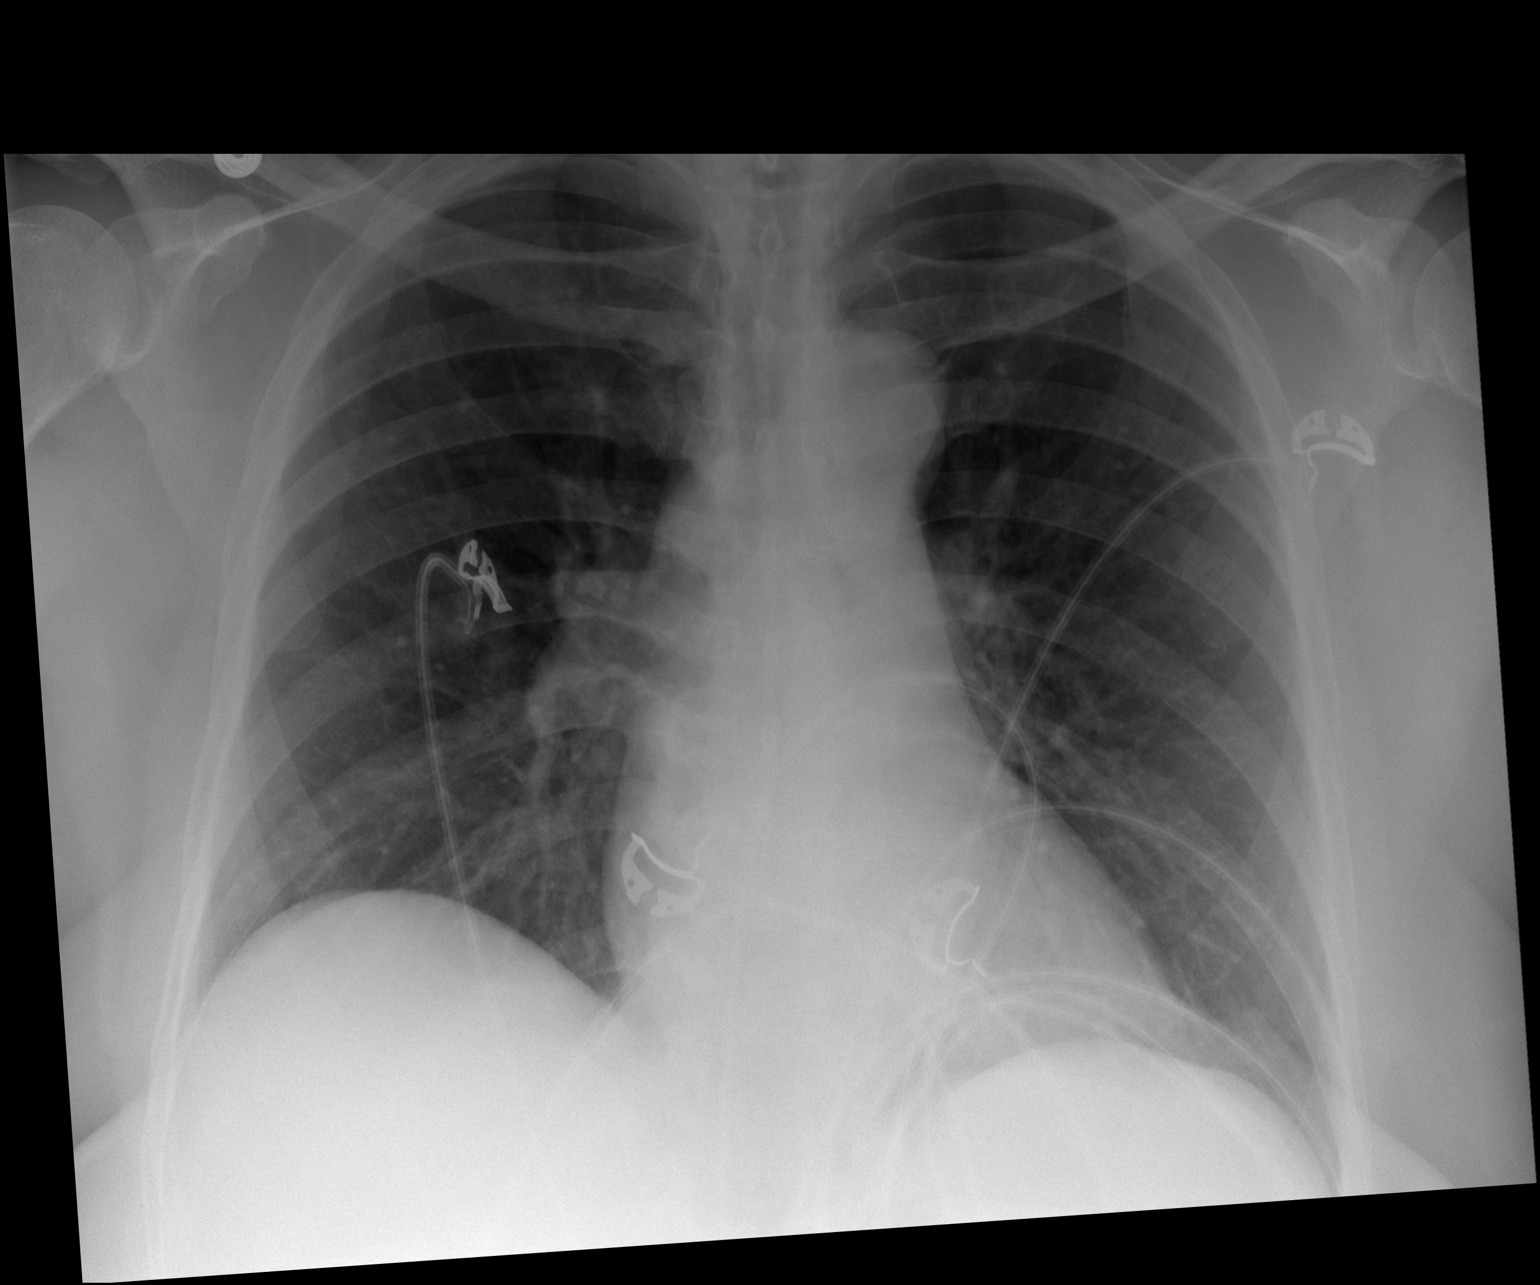

[2 of 2 positions shown; findings below may reference images not displayed]

FINDINGS: The heart size and mediastinal contours are within normal limits.
Both lungs are clear. The visualized skeletal structures are
unremarkable.
IMPRESSION: No active cardiopulmonary disease.

## 2016-11-22 ENCOUNTER — Emergency Department (HOSPITAL_COMMUNITY): Payer: Medicare Other

## 2016-11-22 ENCOUNTER — Emergency Department (HOSPITAL_COMMUNITY)
Admission: EM | Admit: 2016-11-22 | Discharge: 2016-11-22 | Disposition: A | Discharge status: Home / Self Care | Payer: Medicare Other | Attending: Emergency Medicine | Admitting: Emergency Medicine

## 2016-11-22 ENCOUNTER — Encounter (HOSPITAL_COMMUNITY): Payer: Self-pay | Admitting: Nurse Practitioner

## 2016-11-22 DIAGNOSIS — Z23 Encounter for immunization: Secondary | ICD-10-CM | POA: Diagnosis not present

## 2016-11-22 DIAGNOSIS — Y9301 Activity, walking, marching and hiking: Secondary | ICD-10-CM | POA: Diagnosis not present

## 2016-11-22 DIAGNOSIS — Y92014 Private driveway to single-family (private) house as the place of occurrence of the external cause: Secondary | ICD-10-CM | POA: Insufficient documentation

## 2016-11-22 DIAGNOSIS — Z79899 Other long term (current) drug therapy: Secondary | ICD-10-CM | POA: Diagnosis not present

## 2016-11-22 DIAGNOSIS — Z87891 Personal history of nicotine dependence: Secondary | ICD-10-CM | POA: Insufficient documentation

## 2016-11-22 DIAGNOSIS — W25XXXA Contact with sharp glass, initial encounter: Secondary | ICD-10-CM | POA: Diagnosis not present

## 2016-11-22 DIAGNOSIS — S99921A Unspecified injury of right foot, initial encounter: Secondary | ICD-10-CM | POA: Diagnosis present

## 2016-11-22 DIAGNOSIS — Y999 Unspecified external cause status: Secondary | ICD-10-CM | POA: Diagnosis not present

## 2016-11-22 DIAGNOSIS — S91341A Puncture wound with foreign body, right foot, initial encounter: Secondary | ICD-10-CM | POA: Diagnosis not present

## 2016-11-22 DIAGNOSIS — S90851A Superficial foreign body, right foot, initial encounter: Secondary | ICD-10-CM

## 2016-11-22 MED ORDER — TETANUS-DIPHTH-ACELL PERTUSSIS 5-2.5-18.5 LF-MCG/0.5 IM SUSP
0.5000 mL | Freq: Once | INTRAMUSCULAR | Status: AC
  Administered 2016-11-22: 0.5 mL via INTRAMUSCULAR
  Filled 2016-11-22: qty 0.5

## 2016-11-22 NOTE — ED Provider Notes (Signed)
[facility] DEPT Provider Note   CSN: [id number] Arrival date & time: 11/22/16  1543   By signing my name below, I, Janina Mayo, attest that this documentation has been prepared under the direction and in the presence of Martin[place] Russo, PA-C. Electronically Signed: Janina Mayo, Scribe. 11/22/2016. 6:06 PM.   History   Chief Complaint No chief complaint on file.   The history is provided by the patient. No language interpreter was used.    HPI Comments: Martha Lara is a 76 y.o. female who presents to the Emergency Department complaining of glass in her right foot onset today. Pt states that she accidentally stepped on glass while in her driveway. She has not taken any medications for her right foot pain. She notes that she is unsure of the status of her tetanus vaccination. She denies right foot pain, swelling, numbness, tingling and any other symptoms. Denies history of diabetes or immunocompromise.   Past Medical History:  Diagnosis Date  . Meniere's disease   . Vertigo     Patient Active Problem List   Diagnosis Date Noted  . Rhinitis, allergic 08/03/2015    Past Surgical History:  Procedure Laterality Date  . CHOLECYSTECTOMY    . FRACTURE SURGERY    . TONSILLECTOMY      OB History    No data available       Home Medications    Prior to Admission medications   Medication Sig Start Date End Date Taking? Authorizing Provider  benzonatate (TESSALON) 100 MG capsule Take 1 capsule (100 mg total) by mouth 3 (three) times daily as needed for cough. 10/28/15   Wendie Agreste, MD  cetirizine (ZYRTEC) 10 MG tablet Take 10 mg by mouth daily.    [provider]  fluticasone (FLONASE) 50 MCG/ACT nasal spray Place 2 sprays into both nostrils daily. 04/19/14   Gerda Diss, DO  LORazepam (ATIVAN) 1 MG tablet Take 1 mg by mouth 3 (three) times daily as needed for anxiety. Reported on 08/03/2015    [provider]  meclizine (ANTIVERT) 12.5 MG  tablet Take 12.5 mg by mouth daily.     [provider]  montelukast (SINGULAIR) 10 MG tablet Take 1 tablet (10 mg total) by mouth at bedtime. 08/03/15   Ezekiel Slocumb, PA-C    Family History Family History  Problem Relation Age of Onset  . Cancer Father     Social History Social History  Substance Use Topics  . Smoking status: Former Research scientist (life sciences)  . Smokeless tobacco: Never Used  . Alcohol use No     Allergies   Patient has no known allergies.   Review of Systems Review of Systems  Musculoskeletal: Negative for arthralgias and joint swelling.  Skin: Positive for wound (FB (glass) to right foot).  Allergic/Immunologic: Negative for immunocompromised state.  Neurological: Negative for numbness.       No tingling.     Physical Exam Updated Vital Signs BP (!) 125/99 (BP Location: Left Arm)   Pulse 80   Temp 98.3 F (36.8 C) (Oral)   Resp 18   Ht 5\' 5"  (1.651 m)   Wt 206 lb (93.4 kg)   SpO2 100%   BMI 34.28 kg/m   Physical Exam  Constitutional: She appears well-developed and well-nourished. No distress.  HENT:  Head: Normocephalic and atraumatic.  Eyes: Conjunctivae are normal.  Cardiovascular: Normal rate.   Pulmonary/Chest: Effort normal.  Musculoskeletal: Normal range of motion.  Right plantar aspect of foot just  proximal to toes with puncture wound and small 2 mm piece of triangular shaped glass; removed without difficulty or complication. No other foreign objects observed at base of wound. Puncture wound is superficial. No surrounding erythema or edema.   Psychiatric: She has a normal mood and affect. Her behavior is normal.  Nursing note and vitals reviewed.    ED Treatments / Results  DIAGNOSTIC STUDIES:  Oxygen Saturation is 100% on RA, Normal by my interpretation.    COORDINATION OF CARE:  6:03 PM Discussed treatment plan with pt at bedside and pt agreed to plan.  Labs (all labs ordered are listed, but only abnormal results are  displayed) Labs Reviewed - No data to display  EKG  EKG Interpretation None       Radiology Dg Foot Complete Right  Result Date: 11/22/2016 CLINICAL DATA:  76 year old female complaining of pain at puncture wound in central metatarsophalangeal region. EXAM: RIGHT FOOT COMPLETE - 3+ VIEW COMPARISON:  No priors. FINDINGS: There is either posttraumatic absence or post surgical absence of the distal aspect of the fifth proximal phalanx. No acute displaced fracture, subluxation or dislocation is noted. No retained radiopaque foreign body in the soft tissues of the foot. IMPRESSION: 1. No retained radiopaque foreign body in the soft tissues of the foot. No acute radiographic abnormalities of the foot. Electronically Signed   By: Vinnie Langton M.D.   On: 11/22/2016 17:32    Procedures .Foreign Body Removal Date/Time: 11/22/2016 6:30 PM Performed by: RUSSO, Martinique N Authorized by: RUSSO, [place] N  Consent: Verbal consent obtained. Risks and benefits: risks, benefits and alternatives were discussed Consent given by: patient Patient understanding: patient states understanding of the procedure being performed Body area: skin General location: lower extremity Location details: right foot  Sedation: Patient sedated: no Patient restrained: no Patient cooperative: yes Localization method: visualized Removal mechanism: forceps Dressing: dressing applied and antibiotic ointment Tendon involvement: none Depth: subcutaneous (superficial) Complexity: simple 1 objects recovered. Objects recovered: 86mm piece of triangular shaped glass Post-procedure assessment: foreign body removed Patient tolerance: Patient tolerated the procedure well with no immediate complications   (including critical care time)  Medications Ordered in ED Medications  Tdap (BOOSTRIX) injection 0.5 mL (0.5 mLs Intramuscular Given 11/22/16 1840)     Initial Impression / Assessment and Plan / ED Course  I have  reviewed the triage vital signs and the nursing notes.  Pertinent labs & imaging results that were available during my care of the patient were reviewed by me and considered in my medical decision making (see chart for details).     Patient with superficial foreign body in right foot. Object removed without complication, x-ray without acute pathology. Puncture wound is superficial, not requiring closure. Wound irrigated, bacitracin applied, bandaged. Tetanus updated. Wound care discussed. Patient encouraged to follow up with her PCP if symptoms persist. Patient safe for discharge home.  Discussed results, findings, treatment and follow up. Patient advised of return precautions. Patient verbalized understanding and agreed with plan.  Final Clinical Impressions(s) / ED Diagnoses   Final diagnoses:  Foreign body in right foot, initial encounter    New Prescriptions Discharge Medication List as of 11/22/2016  6:24 PM    I personally performed the services described in this documentation, which was scribed in my presence. The recorded information has been reviewed and is accurate.      Russo, [place] N, PA-C 11/22/16 2108    Dorie Rank, MD 11/23/16 [phone]

## 2016-11-22 NOTE — Discharge Instructions (Signed)
Please read instructions below.  Keep your wound clean, dry, and covered. Follow up with your primary care or urgent care as needed if symptoms worsen. Return to the ER for fever, pus draining from wound, redness, or new or worsening symptoms.

## 2016-11-22 NOTE — ED Triage Notes (Signed)
Pt states she stepped on something sharp, suspects it is a piece of glass on her right foot.

## 2016-11-22 NOTE — ED Notes (Signed)
PT DISCHARGED. INSTRUCTIONS GIVEN. AAOX4. PT IN NO APPARENT DISTRESS OR PAIN. THE OPPORTUNITY TO ASK QUESTIONS WAS PROVIDED. 

## 2017-06-09 ENCOUNTER — Other Ambulatory Visit: Payer: Self-pay | Admitting: Internal Medicine

## 2017-06-09 DIAGNOSIS — Z1231 Encounter for screening mammogram for malignant neoplasm of breast: Secondary | ICD-10-CM

## 2017-07-09 ENCOUNTER — Ambulatory Visit: Payer: Medicare Other

## 2018-01-11 ENCOUNTER — Other Ambulatory Visit: Payer: Self-pay

## 2018-01-11 ENCOUNTER — Ambulatory Visit (INDEPENDENT_AMBULATORY_CARE_PROVIDER_SITE_OTHER): Payer: Medicare Other

## 2018-01-11 ENCOUNTER — Ambulatory Visit: Payer: Medicare Other | Admitting: Physician Assistant

## 2018-01-11 ENCOUNTER — Encounter: Payer: Self-pay | Admitting: Physician Assistant

## 2018-01-11 VITALS — BP 130/70 | HR 83 | Temp 99.0°F | Resp 18 | Ht 65.0 in | Wt 201.2 lb

## 2018-01-11 DIAGNOSIS — R05 Cough: Secondary | ICD-10-CM

## 2018-01-11 DIAGNOSIS — R059 Cough, unspecified: Secondary | ICD-10-CM

## 2018-01-11 DIAGNOSIS — R062 Wheezing: Secondary | ICD-10-CM

## 2018-01-11 DIAGNOSIS — J209 Acute bronchitis, unspecified: Secondary | ICD-10-CM

## 2018-01-11 LAB — POCT CBC
Granulocyte percent: 61.9 %G (ref 37–80)
HCT, POC: 41.5 % (ref 37.7–47.9)
Hemoglobin: 12.7 g/dL (ref 12.2–16.2)
Lymph, poc: 1.6 (ref 0.6–3.4)
MCH: 29.1 pg (ref 27–31.2)
MCHC: 30.6 g/dL — AB (ref 31.8–35.4)
MCV: 95.3 fL (ref 80–97)
MID (cbc): 0.3 (ref 0–0.9)
MPV: 7.4 fL (ref 0–99.8)
PLATELET COUNT, POC: 319 10*3/uL (ref 142–424)
POC Granulocyte: 3 (ref 2–6.9)
POC LYMPH PERCENT: 32.5 %L (ref 10–50)
POC MID %: 5.6 %M (ref 0–12)
RBC: 4.36 M/uL (ref 4.04–5.48)
RDW, POC: 13.7 %
WBC: 4.9 10*3/uL (ref 4.6–10.2)

## 2018-01-11 MED ORDER — ALBUTEROL SULFATE (2.5 MG/3ML) 0.083% IN NEBU
2.5000 mg | INHALATION_SOLUTION | Freq: Once | RESPIRATORY_TRACT | Status: AC
  Administered 2018-01-11: 2.5 mg via RESPIRATORY_TRACT

## 2018-01-11 MED ORDER — BENZONATATE 100 MG PO CAPS: 100.0000 mg | ORAL_CAPSULE | Freq: Three times a day (TID) | ORAL | 0 refills | Status: DC | PRN

## 2018-01-11 MED ORDER — PREDNISONE 20 MG PO TABS: 40.0000 mg | ORAL_TABLET | Freq: Every day | ORAL | 0 refills | Status: DC

## 2018-01-11 MED ORDER — ALBUTEROL SULFATE HFA 108 (90 BASE) MCG/ACT IN AERS: 2.0000 | INHALATION_SPRAY | RESPIRATORY_TRACT | 1 refills | Status: AC | PRN

## 2018-01-11 NOTE — Progress Notes (Signed)
MRN: [id number] DOB: May 16, 1941  Subjective:   Martha Lara is a 77 y.o. female presenting for chief complaint of Cough (congestion started weds making hearing worse ) .  Reports 1 week history of illness. Started in face and has traveled to chest now. Has nasal congestion, ear fullness, stuffy nose, PND, and productive cough.   Cough feels less deep than when it started. Has tried nyquil, flonase, and zyrtec with some relief. Denies fever, sore throat, wheezing, shortness of breath, chest tightness, chest pain and myalgia, nausea, vomiting, abdominal pain and diarrhea. Has not had sick contact with anyone. Hasd history of seasonal allergies,no history of asthma. Patient has not had flu shot this season. Denies smoking. Has PMH of sinusitis. Denies any other aggravating or relieving factors, no other questions or concerns.  ROS per HPI  [name] has a current medication list which includes the following prescription(s): cetirizine, fluticasone, meclizine, metformin, benzonatate, lorazepam, and montelukast, and the following Facility-Administered Medications: albuterol. Also has No Known Allergies.  [name]  has a past medical history of Meniere's disease and Vertigo. Also  has a past surgical history that includes Cholecystectomy; Tonsillectomy; and Fracture surgery.   Objective:   Vitals: BP 130/70   Pulse 83   Temp 99 F (37.2 C) (Oral)   Resp 18   Ht 5\' 5"  (1.651 m)   Wt 201 lb 3.2 oz (91.3 kg)   SpO2 98%   BMI 33.48 kg/m   Physical Exam  Constitutional: She is oriented to person, place, and time. She appears well-developed and well-nourished. No distress.  HENT:  Head: Normocephalic and atraumatic.  Right Ear: Tympanic membrane, external ear and ear canal normal.  Left Ear: Tympanic membrane, external ear and ear canal normal.  Nose: Mucosal edema (moderate b/l) present. Right sinus exhibits maxillary sinus tenderness. Right sinus exhibits no frontal sinus tenderness. Left  sinus exhibits maxillary sinus tenderness. Left sinus exhibits no frontal sinus tenderness.  Mouth/Throat: Uvula is midline and mucous membranes are normal. Posterior oropharyngeal erythema present. No posterior oropharyngeal edema or tonsillar abscesses. No tonsillar exudate.  Eyes: Conjunctivae are normal.  Neck: Normal range of motion.  Cardiovascular: Normal rate, regular rhythm, normal heart sounds and intact distal pulses.  Pulmonary/Chest: Effort normal. She has no decreased breath sounds. She has wheezes (few wheezes ausculatated bilaterally ). She has rhonchi (diffuse rhonchi in b/l lung fields, improved with cough). She has no rales.  Lymphadenopathy:       Head (right side): No submental, no submandibular, no tonsillar, no preauricular, no posterior auricular and no occipital adenopathy present.       Head (left side): No submental, no submandibular, no tonsillar, no preauricular, no posterior auricular and no occipital adenopathy present.    She has no cervical adenopathy.       Right: No supraclavicular adenopathy present.       Left: No supraclavicular adenopathy present.  Neurological: She is alert and oriented to person, place, and time.  Skin: Skin is warm and dry.  Psychiatric: She has a normal mood and affect.  Vitals reviewed.   Breathing treatment of albuterol given in office. Patient reports feeling much more open. One scant wheeze and few rhonchi throughput b/l lung fileds auscultated on lung exam.    Results for orders placed or performed in visit on 01/11/18 (from the past 24 hour(s))  POCT CBC     Status: Abnormal   Collection Time: 01/11/18  3:06 PM  Result Value Ref Range  WBC 4.9 4.6 - 10.2 K/uL   Lymph, poc 1.6 0.6 - 3.4   POC LYMPH PERCENT 32.5 10 - 50 %L   MID (cbc) 0.3 0 - 0.9   POC MID % 5.6 0 - 12 %M   POC Granulocyte 3.0 2 - 6.9   Granulocyte percent 61.9 37 - 80 %G   RBC 4.36 4.04 - 5.48 M/uL   Hemoglobin 12.7 12.2 - 16.2 g/dL   HCT, POC 41.5  37.7 - 47.9 %   MCV 95.3 80 - 97 fL   MCH, POC 29.1 27 - 31.2 pg   MCHC 30.6 (A) 31.8 - 35.4 g/dL   RDW, POC 13.7 %   Platelet Count, POC 319 142 - 424 K/uL   MPV 7.4 0 - 99.8 fL    Dg Chest 2 View  Result Date: 01/11/2018 CLINICAL DATA:  Cough for 1 week EXAM: CHEST - 2 VIEW COMPARISON:  June 28, 2016 FINDINGS: There is no edema or consolidation. The heart size and pulmonary vascular normal. Aorta is mildly tortuous but stable. No evident adenopathy. There is mild degenerative change in the thoracic spine. IMPRESSION: No edema or consolidation.  Stable cardiac silhouette. Electronically Signed   By: Lowella Grip III M.D.   On: 01/11/2018 15:15    Assessment and Plan :  1. Acute bronchitis with bronchospasm Patient is overall well-appearing, no acute distress.  Vitals stable.  WBC WNL.  No acute findings noted on chest x-ray.  Symptoms improved with albuterol treatment.  Will treat for acute bronchitis with bronchospasm.  Prednisone will also help with sinus congestion.  Do not suspect underlying bacterial etiology at this time.  Advised to follow-up in 2 days for reevaluation or sooner if any symptoms worsen or she develops new concerning symptoms. - predniSONE (DELTASONE) 20 MG tablet; Take 2 tablets (40 mg total) by mouth daily with breakfast for 5 days.  Dispense: 10 tablet; Refill: 0 - benzonatate (TESSALON) 100 MG capsule; Take 1-2 capsules (100-200 mg total) by mouth 3 (three) times daily as needed for cough.  Dispense: 40 capsule; Refill: 0 - albuterol (PROVENTIL HFA;VENTOLIN HFA) 108 (90 Base) MCG/ACT inhaler; Inhale 2 puffs into the lungs every 4 (four) hours as needed for wheezing or shortness of breath (cough, shortness of breath or wheezing.).  Dispense: 1 Inhaler; Refill: 1  2. Cough - POCT CBC - DG Chest 2 View; Future  3. Wheezes - albuterol (PROVENTIL) (2.5 MG/3ML) 0.083% nebulizer solution 2.5 mg  Side effects, risks, benefits, and alternatives of the  medications and treatment plan prescribed today were discussed, and patient expressed understanding of the instructions given. No barriers to understanding were identified. Red flags discussed in detail. Pt expressed understanding regarding what to do in case of emergency/urgent symptoms.  Tenna Delaine, PA-C  [facility] at [facility] 01/11/2018 3:21 PM

## 2018-01-11 NOTE — Patient Instructions (Addendum)
Continue allergy medicine. Start tessalon perles and prednisone for cough and inflammation.We are going to treat your underlying inflammation with oral prednisone. Prednisone is a steroid and can cause side effects such as headache, irritability, nausea, vomiting, increased heart rate, increased blood pressure, increased blood sugar, appetite changes, and insomnia. Please take tablets in the morning with a full meal to help decrease the chances of these side effects.   Use albuterol inhaler every 4-6 hours for wheezing.   Return in 2 days for reevaluation.   Thank you for letting me participate in your health and well being.   Bronchospasm, Adult Bronchospasm is when airways in the lungs get smaller. When this happens, it can be hard to breathe. You may cough. You may also make a whistling sound when you breathe (wheeze). Follow these instructions at home: Medicines  Take over-the-counter and prescription medicines only as told by your doctor.  If you need to use an inhaler or nebulizer to take your medicine, ask your doctor how to use it.  If you were given a spacer, always use it with your inhaler. Lifestyle  Change your heating and air conditioning filter. Do this at least once a month.  Try not to use fireplaces and wood stoves.  Do not  smoke. Do not  allow smoking in your home.  Try not to use things that have a strong smell, like perfume.  Get rid of pests (such as roaches and mice) and their poop.  Remove any mold from your home.  Keep your house clean. Get rid of dust.  Use cleaning products that have no smell.  Replace carpet with wood, tile, or vinyl flooring.  Use allergy-proof pillows, mattress covers, and box spring covers.  Wash bed sheets and blankets every week. Use hot water. Dry them in a dryer.  Use blankets that are made of polyester or cotton.  Wash your hands often.  Keep pets out of your bedroom.  When you exercise, try not to breathe in cold  air. General instructions  Have a plan for getting medical care. Know these things: ? When to call your doctor. ? When to call local emergency services (911 in the [place] ? Where to go in an emergency.  Stay up to date on your shots (immunizations).  When you have an episode: ? Stay calm. ? Relax. ? Breathe slowly. Contact a doctor if:  Your muscles ache.  Your chest hurts.  The color of the mucus you cough up (sputum) changes from clear or white to yellow, green, gray, or bloody.  The mucus you cough up gets thicker.  You have a fever. Get help right away if:  The whistling sound gets worse, even after you take your medicines.  Your coughing gets worse.  You find it even harder to breathe.  Your chest hurts very much. Summary  Bronchospasm is when airways in the lungs get smaller.  When this happens, it can be hard to breathe. You may cough. You may also make a whistling sound when you breathe.  Stay away from things that cause you to have episodes. These include smoke or dust. This information is not intended to replace advice given to you by your health care provider. Make sure you discuss any questions you have with your health care provider. Document Released: 04/12/2009 Document Revised: 06/18/2016 Document Reviewed: 06/18/2016 Elsevier Interactive Patient Education  2017 [employer].    IF you received an x-ray today, you will receive an invoice from [facility]. Please contact [facility] at [phone] with questions or concerns regarding your invoice.   IF you received labwork today, you will receive an invoice from [facility]. Please contact [facility] at [phone] with questions or concerns regarding your invoice.   Our billing staff will not be able to assist you with questions regarding bills from these companies.  You will be contacted with the lab results as soon as they are available. The fastest way to get your results is to  activate your My Chart account. Instructions are located on the last page of this paperwork. If you have not heard from [place] regarding the results in 2 weeks, please contact this office.

## 2018-01-12 ENCOUNTER — Encounter: Payer: Self-pay | Admitting: Physician Assistant

## 2018-01-13 ENCOUNTER — Encounter: Payer: Self-pay | Admitting: Physician Assistant

## 2018-01-13 ENCOUNTER — Ambulatory Visit (INDEPENDENT_AMBULATORY_CARE_PROVIDER_SITE_OTHER): Payer: Medicare Other | Admitting: Physician Assistant

## 2018-01-13 ENCOUNTER — Other Ambulatory Visit: Payer: Self-pay

## 2018-01-13 VITALS — BP 124/78 | HR 82 | Temp 98.9°F | Ht 65.0 in | Wt 202.8 lb

## 2018-01-13 DIAGNOSIS — R0981 Nasal congestion: Secondary | ICD-10-CM | POA: Diagnosis not present

## 2018-01-13 DIAGNOSIS — J209 Acute bronchitis, unspecified: Secondary | ICD-10-CM

## 2018-01-13 MED ORDER — PREDNISONE 10 MG PO TABS: 10.0000 mg | ORAL_TABLET | Freq: Every day | ORAL | 0 refills | Status: DC

## 2018-01-13 MED ORDER — IPRATROPIUM BROMIDE 0.02 % IN SOLN
0.5000 mg | Freq: Once | RESPIRATORY_TRACT | Status: AC
  Administered 2018-01-13: 0.5 mg via RESPIRATORY_TRACT

## 2018-01-13 MED ORDER — MUCINEX DM MAXIMUM STRENGTH 60-1200 MG PO TB12: 1.0000 | ORAL_TABLET | Freq: Two times a day (BID) | ORAL | 0 refills | Status: DC

## 2018-01-13 MED ORDER — ALBUTEROL SULFATE (2.5 MG/3ML) 0.083% IN NEBU
2.5000 mg | INHALATION_SOLUTION | Freq: Once | RESPIRATORY_TRACT | Status: AC
  Administered 2018-01-13: 2.5 mg via RESPIRATORY_TRACT

## 2018-01-13 NOTE — Progress Notes (Signed)
Martha Lara  MRN: [id number] DOB: [birth year]  Subjective:  Martha Lara is a 77 y.o. female seen in office today for a chief complaint of follow-up on acute bronchitis with normal-sized.  Patient was last seen on 01/11/2018.  Chest x-ray negative. WBC wnl Given breathing treatment in office.  Started on prednisone.  Today, pt reports feeling somewhat better. Still having productive hacking cough (white phlegm), occasional wheezing, nasal congestion, sinus pressure, and ear fullness. She is taking prednisone, tessalon perles, allergy medicine, and albuterol inhaler as prescribed. Feels the best after albuterol inhaler. Denies fever, sore throat, wheezing, shortness of breath, chest tightness, chest pain and myalgia, nausea, vomiting, abdominal pain and diarrhea.   Review of Systems  Per HPI  Patient Active Problem List   Diagnosis Date Noted  . Rhinitis, allergic 08/03/2015    Current Outpatient Medications on File Prior to Visit  Medication Sig Dispense Refill  . albuterol (PROVENTIL HFA;VENTOLIN HFA) 108 (90 Base) MCG/ACT inhaler Inhale 2 puffs into the lungs every 4 (four) hours as needed for wheezing or shortness of breath (cough, shortness of breath or wheezing.). 1 Inhaler 1  . benzonatate (TESSALON) 100 MG capsule Take 1-2 capsules (100-200 mg total) by mouth 3 (three) times daily as needed for cough. 40 capsule 0  . cetirizine (ZYRTEC) 10 MG tablet Take 10 mg by mouth daily.    . fluticasone (FLONASE) 50 MCG/ACT nasal spray Place 2 sprays into both nostrils daily. 16 g 1  . LORazepam (ATIVAN) 1 MG tablet Take 1 mg by mouth 3 (three) times daily as needed for anxiety. Reported on 08/03/2015    . meclizine (ANTIVERT) 12.5 MG tablet Take 12.5 mg by mouth daily.     . metFORMIN (GLUCOPHAGE) 500 MG tablet 1 TABLET WITH EVENING MEAL ORALLY 90  3  . montelukast (SINGULAIR) 10 MG tablet Take 1 tablet (10 mg total) by mouth at bedtime. 30 tablet 3  . predniSONE (DELTASONE) 20 MG tablet Take  2 tablets (40 mg total) by mouth daily with breakfast for 5 days. 10 tablet 0   No current facility-administered medications on file prior to visit.     No Known Allergies   Objective:  BP 124/78 (BP Location: Left Arm, Patient Position: Sitting, Cuff Size: Normal)   Pulse 82   Temp 98.9 F (37.2 C) (Oral)   Ht 5\' 5"  (1.651 m)   Wt 202 lb 12.8 oz (92 kg)   SpO2 96%   BMI 33.75 kg/m   Physical Exam  Constitutional: She is oriented to person, place, and time. She appears well-developed and well-nourished. No distress.  HENT:  Head: Normocephalic and atraumatic.  Nose: Mucosal edema and rhinorrhea present. Right sinus exhibits no maxillary sinus tenderness and no frontal sinus tenderness. Left sinus exhibits no maxillary sinus tenderness and no frontal sinus tenderness.  Eyes: Conjunctivae are normal.  Neck: Normal range of motion.  Pulmonary/Chest: Effort normal. No accessory muscle usage. No tachypnea. She has no decreased breath sounds. She has wheezes (few wheezes auscultated througout b/l lung fields). She has rhonchi (most notable in b/l lower lung fields). She has no rales.  Neurological: She is alert and oriented to person, place, and time.  Skin: Skin is warm and dry.  Psychiatric: She has a normal mood and affect.  Vitals reviewed.  Breathing treatment of albuterol given in office. Patient reports feeling improvement. Wheezing resolved. Few rhonchi auscultated on lung exam. Peak flow is 325 pre and post breathing treatment, which is  100% of age predicted.    Assessment and Plan :  1. Acute bronchitis with bronchospasm Improving. Pt is overall well appearing, NAD. Vital stable. Breathing tx given. Peak flow reassuring. Continue medication regimen. Will add on mucinex to regimen. Advised to return to clinic if symptoms worsen, do not improve in 5 days, or as needed. If no full improvement by then, consider abx.  - albuterol (PROVENTIL) (2.5 MG/3ML) 0.083% nebulizer solution  2.5 mg - ipratropium (ATROVENT) nebulizer solution 0.5 mg - Care order/instruction  2. Nasal congestion Start nasal saline rinses. Continue allergy medications.     Tenna Delaine PA-C  [facility] 01/13/2018 3:42 PM

## 2018-01-13 NOTE — Patient Instructions (Addendum)
Start mucinex DM. I recommend doing nasal saline rinses nightly. Continue with prednisone and albuterol inhaler. Follow up if symptoms worsen or do not fully resolve in 5 days. Thank you for letting me participate in your health and well being.     IF you received an x-ray today, you will receive an invoice from [facility]. Please contact [facility] at [phone] with questions or concerns regarding your invoice.   IF you received labwork today, you will receive an invoice from [facility]. Please contact [facility] at [phone] with questions or concerns regarding your invoice.   Our billing staff will not be able to assist you with questions regarding bills from these companies.  You will be contacted with the lab results as soon as they are available. The fastest way to get your results is to activate your My Chart account. Instructions are located on the last page of this paperwork. If you have not heard from [place] regarding the results in 2 weeks, please contact this office.

## 2018-05-23 ENCOUNTER — Other Ambulatory Visit: Payer: Self-pay | Admitting: Internal Medicine

## 2018-05-23 DIAGNOSIS — Z1231 Encounter for screening mammogram for malignant neoplasm of breast: Secondary | ICD-10-CM

## 2018-05-23 DIAGNOSIS — M8580 Other specified disorders of bone density and structure, unspecified site: Secondary | ICD-10-CM

## 2018-07-11 ENCOUNTER — Other Ambulatory Visit: Payer: Self-pay | Admitting: Internal Medicine

## 2018-07-11 DIAGNOSIS — M8580 Other specified disorders of bone density and structure, unspecified site: Secondary | ICD-10-CM

## 2018-07-20 ENCOUNTER — Ambulatory Visit
Admission: RE | Admit: 2018-07-20 | Discharge: 2018-07-20 | Disposition: A | Discharge status: Home / Self Care | Payer: Medicare Other | Source: Ambulatory Visit | Attending: Internal Medicine | Admitting: Internal Medicine

## 2018-07-20 ENCOUNTER — Ambulatory Visit: Payer: Medicare Other

## 2018-07-20 DIAGNOSIS — M8580 Other specified disorders of bone density and structure, unspecified site: Secondary | ICD-10-CM

## 2018-07-20 DIAGNOSIS — Z1231 Encounter for screening mammogram for malignant neoplasm of breast: Secondary | ICD-10-CM

## 2019-06-26 ENCOUNTER — Other Ambulatory Visit: Payer: Self-pay | Admitting: Internal Medicine

## 2019-06-26 DIAGNOSIS — Z1231 Encounter for screening mammogram for malignant neoplasm of breast: Secondary | ICD-10-CM

## 2019-08-07 ENCOUNTER — Ambulatory Visit: Payer: Medicare Other

## 2019-08-10 ENCOUNTER — Ambulatory Visit
Admission: RE | Admit: 2019-08-10 | Discharge: 2019-08-10 | Disposition: A | Discharge status: Home / Self Care | Payer: Medicare PPO | Source: Ambulatory Visit | Attending: Internal Medicine | Admitting: Internal Medicine

## 2019-08-10 ENCOUNTER — Other Ambulatory Visit: Payer: Self-pay

## 2019-08-10 DIAGNOSIS — Z1231 Encounter for screening mammogram for malignant neoplasm of breast: Secondary | ICD-10-CM

## 2019-08-18 ENCOUNTER — Other Ambulatory Visit: Payer: Self-pay | Admitting: Internal Medicine

## 2019-08-18 DIAGNOSIS — R590 Localized enlarged lymph nodes: Secondary | ICD-10-CM

## 2019-09-01 ENCOUNTER — Ambulatory Visit
Admission: RE | Admit: 2019-09-01 | Discharge: 2019-09-01 | Disposition: A | Discharge status: Home / Self Care | Payer: Medicare PPO | Source: Ambulatory Visit | Attending: Internal Medicine | Admitting: Internal Medicine

## 2019-09-01 ENCOUNTER — Other Ambulatory Visit: Payer: Self-pay | Admitting: Internal Medicine

## 2019-09-01 ENCOUNTER — Other Ambulatory Visit: Payer: Self-pay

## 2019-09-01 DIAGNOSIS — R590 Localized enlarged lymph nodes: Secondary | ICD-10-CM

## 2019-11-17 ENCOUNTER — Ambulatory Visit
Admission: EM | Admit: 2019-11-17 | Discharge: 2019-11-17 | Disposition: A | Discharge status: Home / Self Care | Payer: Medicare PPO

## 2019-11-17 DIAGNOSIS — M25471 Effusion, right ankle: Secondary | ICD-10-CM | POA: Diagnosis not present

## 2019-11-17 DIAGNOSIS — M25475 Effusion, left foot: Secondary | ICD-10-CM

## 2019-11-17 NOTE — ED Triage Notes (Signed)
Pt c/o bilateral feet swelling for over a month. States they are fine in the morning, just during the day when up and around. States had a PCP appt but they keep cancelling. Pt denies SOB. States has a since headache that she is taking zyrtec for.

## 2019-11-17 NOTE — Discharge Instructions (Signed)
Important to wear compression stockings every day. May elevate feet as well throughout the day. Go to ER for worsening swelling, chest pain, difficulty breathing.

## 2019-11-17 NOTE — ED Provider Notes (Signed)
[facility]    CSN: [id number] Arrival date & time: 11/17/19  1310      History   Chief Complaint Chief Complaint  Patient presents with  . Leg Swelling    HPI Martha Lara is a 79 y.o. female with history of Mnire's disease, vertigo presenting for bilateral foot and ankle swelling.  States is been ongoing for greater than 1 month.  Patient states that swelling worsens throughout the day with dependency, ambulation.  Patient states elevation, compression stockings of help.  Not wearing compression stockings daily.  Denying chest pain, difficulty breathing, palpitations.  No injury, change in activity level.  Patient denies history of DVT, PE.   Past Medical History:  Diagnosis Date  . Meniere's disease   . Vertigo     Patient Active Problem List   Diagnosis Date Noted  . Rhinitis, allergic 08/03/2015    Past Surgical History:  Procedure Laterality Date  . CHOLECYSTECTOMY    . FRACTURE SURGERY    . TONSILLECTOMY      OB History   No obstetric history on file.      Home Medications    Prior to Admission medications   Medication Sig Start Date End Date Taking? Authorizing Provider  albuterol (PROVENTIL HFA;VENTOLIN HFA) 108 (90 Base) MCG/ACT inhaler Inhale 2 puffs into the lungs every 4 (four) hours as needed for wheezing or shortness of breath (cough, shortness of breath or wheezing.). 01/11/18   Tenna Delaine D, PA-C  cetirizine (ZYRTEC) 10 MG tablet Take 10 mg by mouth daily.    [provider]  meclizine (ANTIVERT) 12.5 MG tablet Take 12.5 mg by mouth daily.     [provider]  metFORMIN (GLUCOPHAGE) 500 MG tablet 1 TABLET WITH EVENING MEAL ORALLY 90 10/29/17   [provider]    Family History Family History  Problem Relation Age of Onset  . Cancer Father     Social History Social History   Tobacco Use  . Smoking status: Former Research scientist (life sciences)  . Smokeless tobacco: Never Used  Substance Use Topics  .  Alcohol use: No    Alcohol/week: 0.0 standard drinks  . Drug use: No     Allergies   Patient has no known allergies.   Review of Systems As per HPI   Physical Exam Triage Vital Signs ED Triage Vitals [11/17/19 1358]  Enc Vitals Group     BP (!) 141/97     Pulse Rate 75     Resp 18     Temp 98.1 F (36.7 C)     Temp Source Oral     SpO2 96 %     Weight      Height      Head Circumference      Peak Flow      Pain Score 0     Pain Loc      Pain Edu?      Excl. in [place]?    No data found.  Updated Vital Signs BP (!) 141/97 (BP Location: Left Arm)   Pulse 75   Temp 98.1 F (36.7 C) (Oral)   Resp 18   SpO2 96%   Visual Acuity Right Eye Distance:   Left Eye Distance:   Bilateral Distance:    Right Eye Near:   Left Eye Near:    Bilateral Near:     Physical Exam Constitutional:      General: She is not in acute distress.    Appearance: She  is obese. She is not ill-appearing.  HENT:     Head: Normocephalic and atraumatic.  Eyes:     General: No scleral icterus.    Pupils: Pupils are equal, round, and reactive to light.  Neck:     Comments: Negative JVD Cardiovascular:     Rate and Rhythm: Normal rate and regular rhythm.     Heart sounds: Normal heart sounds.  Pulmonary:     Effort: Pulmonary effort is normal. No respiratory distress.     Breath sounds: No wheezing or rales.     Comments: Good air entry bilaterally without prolonged expiratory phase Musculoskeletal:     Cervical back: No tenderness.     Right lower leg: Edema present.     Left lower leg: Edema present.     Comments: 2+ pitting bilaterally to shin.  Mild tenderness.  Calves 39 cm, ankles 25 cm bilaterally  Skin:    Coloration: Skin is not jaundiced or pale.  Neurological:     Mental Status: She is alert and oriented to person, place, and time.      UC Treatments / Results  Labs (all labs ordered are listed, but only abnormal results are displayed) Labs Reviewed - No data to  display  EKG   Radiology No results found.  Procedures Procedures (including critical care time)  Medications Ordered in UC Medications - No data to display  Initial Impression / Assessment and Plan / UC Course  I have reviewed the triage vital signs and the nursing notes.  Pertinent labs & imaging results that were available during my care of the patient were reviewed by me and considered in my medical decision making (see chart for details).     Patient febrile, nontoxic, hemodynamically stable in office.  Patient with moderately low risk factors for DVT.  Bilateral legs are symmetric in size.  Low concern for heart failure at this time given reassuring vitals, cardiopulmonary exam.  More likely venous insufficiency.  Will wear compression stockings daily, elevate legs more frequently, and follow-up with PCP for further evaluation if needed.  Return precautions discussed, patient verbalized understanding and is agreeable to plan. Final Clinical Impressions(s) / UC Diagnoses   Final diagnoses:  Bilateral swelling of feet and ankles     Discharge Instructions     Important to wear compression stockings every day. May elevate feet as well throughout the day. Go to ER for worsening swelling, chest pain, difficulty breathing.    ED Prescriptions    None     PDMP not reviewed this encounter.   Neldon [place], [place] 11/18/19 [phone]

## 2019-12-04 ENCOUNTER — Other Ambulatory Visit: Payer: Self-pay

## 2019-12-04 ENCOUNTER — Ambulatory Visit
Admission: RE | Admit: 2019-12-04 | Discharge: 2019-12-04 | Disposition: A | Discharge status: Home / Self Care | Payer: Medicare PPO | Source: Ambulatory Visit | Attending: Internal Medicine | Admitting: Internal Medicine

## 2019-12-04 DIAGNOSIS — R590 Localized enlarged lymph nodes: Secondary | ICD-10-CM

## 2020-05-02 ENCOUNTER — Ambulatory Visit: Payer: Medicare PPO | Attending: Internal Medicine

## 2020-05-02 DIAGNOSIS — Z23 Encounter for immunization: Secondary | ICD-10-CM

## 2020-05-02 NOTE — Progress Notes (Signed)
   Covid-19 Vaccination Clinic  Name:  Martha Lara    MRN: [id number] DOB: 11-14-1940  05/02/2020  [name] was observed post Covid-19 immunization for 15 minutes without incident. She was provided with Vaccine Information Sheet and instruction to access the V-Safe system.   [name] was instructed to call [phone] with any severe reactions post vaccine: Marland Kitchen Difficulty breathing  . Swelling of face and throat  . A fast heartbeat  . A bad rash all over body  . Dizziness and weakness

## 2020-05-14 DIAGNOSIS — R600 Localized edema: Secondary | ICD-10-CM | POA: Diagnosis not present

## 2020-05-14 DIAGNOSIS — M25569 Pain in unspecified knee: Secondary | ICD-10-CM | POA: Diagnosis not present

## 2020-05-14 DIAGNOSIS — M79606 Pain in leg, unspecified: Secondary | ICD-10-CM | POA: Diagnosis not present

## 2020-05-15 ENCOUNTER — Ambulatory Visit (HOSPITAL_COMMUNITY)
Admission: RE | Admit: 2020-05-15 | Discharge: 2020-05-15 | Disposition: A | Discharge status: Home / Self Care | Payer: Medicare PPO | Source: Ambulatory Visit | Attending: Internal Medicine | Admitting: Internal Medicine

## 2020-05-15 ENCOUNTER — Other Ambulatory Visit: Payer: Self-pay

## 2020-05-15 ENCOUNTER — Other Ambulatory Visit (HOSPITAL_COMMUNITY): Payer: Self-pay | Admitting: Internal Medicine

## 2020-05-15 DIAGNOSIS — R600 Localized edema: Secondary | ICD-10-CM | POA: Diagnosis not present

## 2020-05-20 DIAGNOSIS — H52203 Unspecified astigmatism, bilateral: Secondary | ICD-10-CM | POA: Diagnosis not present

## 2020-05-20 DIAGNOSIS — Z961 Presence of intraocular lens: Secondary | ICD-10-CM | POA: Diagnosis not present

## 2020-05-20 DIAGNOSIS — E119 Type 2 diabetes mellitus without complications: Secondary | ICD-10-CM | POA: Diagnosis not present

## 2020-05-20 DIAGNOSIS — Z7984 Long term (current) use of oral hypoglycemic drugs: Secondary | ICD-10-CM | POA: Diagnosis not present

## 2020-05-20 DIAGNOSIS — H524 Presbyopia: Secondary | ICD-10-CM | POA: Diagnosis not present

## 2020-05-21 DIAGNOSIS — M1711 Unilateral primary osteoarthritis, right knee: Secondary | ICD-10-CM | POA: Diagnosis not present

## 2020-05-21 DIAGNOSIS — M25561 Pain in right knee: Secondary | ICD-10-CM | POA: Diagnosis not present

## 2020-05-21 DIAGNOSIS — M25461 Effusion, right knee: Secondary | ICD-10-CM | POA: Diagnosis not present

## 2020-06-05 DIAGNOSIS — E782 Mixed hyperlipidemia: Secondary | ICD-10-CM | POA: Diagnosis not present

## 2020-06-05 DIAGNOSIS — Z7984 Long term (current) use of oral hypoglycemic drugs: Secondary | ICD-10-CM | POA: Diagnosis not present

## 2020-06-05 DIAGNOSIS — Z1389 Encounter for screening for other disorder: Secondary | ICD-10-CM | POA: Diagnosis not present

## 2020-06-05 DIAGNOSIS — M179 Osteoarthritis of knee, unspecified: Secondary | ICD-10-CM | POA: Diagnosis not present

## 2020-06-05 DIAGNOSIS — E1169 Type 2 diabetes mellitus with other specified complication: Secondary | ICD-10-CM | POA: Diagnosis not present

## 2020-06-05 DIAGNOSIS — J309 Allergic rhinitis, unspecified: Secondary | ICD-10-CM | POA: Diagnosis not present

## 2020-06-05 DIAGNOSIS — Z0000 Encounter for general adult medical examination without abnormal findings: Secondary | ICD-10-CM | POA: Diagnosis not present

## 2020-06-05 DIAGNOSIS — Z23 Encounter for immunization: Secondary | ICD-10-CM | POA: Diagnosis not present

## 2020-09-16 DIAGNOSIS — Z03818 Encounter for observation for suspected exposure to other biological agents ruled out: Secondary | ICD-10-CM | POA: Diagnosis not present

## 2020-09-16 DIAGNOSIS — Z20822 Contact with and (suspected) exposure to covid-19: Secondary | ICD-10-CM | POA: Diagnosis not present

## 2020-09-24 DIAGNOSIS — H903 Sensorineural hearing loss, bilateral: Secondary | ICD-10-CM | POA: Diagnosis not present

## 2020-09-24 DIAGNOSIS — H6123 Impacted cerumen, bilateral: Secondary | ICD-10-CM | POA: Diagnosis not present

## 2020-09-24 DIAGNOSIS — H8101 Meniere's disease, right ear: Secondary | ICD-10-CM | POA: Diagnosis not present

## 2020-09-24 DIAGNOSIS — H608X2 Other otitis externa, left ear: Secondary | ICD-10-CM | POA: Diagnosis not present

## 2020-10-03 DIAGNOSIS — H903 Sensorineural hearing loss, bilateral: Secondary | ICD-10-CM | POA: Diagnosis not present

## 2020-10-04 ENCOUNTER — Other Ambulatory Visit: Payer: Self-pay | Admitting: Internal Medicine

## 2020-10-04 DIAGNOSIS — Z1231 Encounter for screening mammogram for malignant neoplasm of breast: Secondary | ICD-10-CM

## 2020-10-07 ENCOUNTER — Ambulatory Visit: Payer: Medicare PPO

## 2020-11-26 ENCOUNTER — Ambulatory Visit
Admission: RE | Admit: 2020-11-26 | Discharge: 2020-11-26 | Disposition: A | Discharge status: Home / Self Care | Payer: Medicare PPO | Source: Ambulatory Visit | Attending: Internal Medicine | Admitting: Internal Medicine

## 2020-11-26 ENCOUNTER — Other Ambulatory Visit: Payer: Self-pay

## 2020-11-26 DIAGNOSIS — Z1231 Encounter for screening mammogram for malignant neoplasm of breast: Secondary | ICD-10-CM

## 2020-11-27 ENCOUNTER — Other Ambulatory Visit: Payer: Self-pay | Admitting: Internal Medicine

## 2020-11-27 DIAGNOSIS — N644 Mastodynia: Secondary | ICD-10-CM

## 2020-12-02 ENCOUNTER — Other Ambulatory Visit: Payer: Self-pay

## 2020-12-02 ENCOUNTER — Ambulatory Visit
Admission: RE | Admit: 2020-12-02 | Discharge: 2020-12-02 | Disposition: A | Discharge status: Home / Self Care | Payer: Medicare PPO | Source: Ambulatory Visit | Attending: Internal Medicine | Admitting: Internal Medicine

## 2020-12-02 DIAGNOSIS — N644 Mastodynia: Secondary | ICD-10-CM

## 2020-12-02 DIAGNOSIS — N6002 Solitary cyst of left breast: Secondary | ICD-10-CM | POA: Diagnosis not present

## 2020-12-05 DIAGNOSIS — E1169 Type 2 diabetes mellitus with other specified complication: Secondary | ICD-10-CM | POA: Diagnosis not present

## 2020-12-05 DIAGNOSIS — E782 Mixed hyperlipidemia: Secondary | ICD-10-CM | POA: Diagnosis not present

## 2020-12-05 DIAGNOSIS — M179 Osteoarthritis of knee, unspecified: Secondary | ICD-10-CM | POA: Diagnosis not present

## 2020-12-05 DIAGNOSIS — E7800 Pure hypercholesterolemia, unspecified: Secondary | ICD-10-CM | POA: Diagnosis not present

## 2020-12-10 DIAGNOSIS — H60332 Swimmer's ear, left ear: Secondary | ICD-10-CM | POA: Diagnosis not present

## 2020-12-10 DIAGNOSIS — H903 Sensorineural hearing loss, bilateral: Secondary | ICD-10-CM | POA: Diagnosis not present

## 2020-12-10 DIAGNOSIS — H8101 Meniere's disease, right ear: Secondary | ICD-10-CM | POA: Diagnosis not present

## 2021-01-02 DIAGNOSIS — Z20822 Contact with and (suspected) exposure to covid-19: Secondary | ICD-10-CM | POA: Diagnosis not present

## 2021-01-02 DIAGNOSIS — Z03818 Encounter for observation for suspected exposure to other biological agents ruled out: Secondary | ICD-10-CM | POA: Diagnosis not present

## 2021-03-10 DIAGNOSIS — H8101 Meniere's disease, right ear: Secondary | ICD-10-CM | POA: Diagnosis not present

## 2021-03-10 DIAGNOSIS — H903 Sensorineural hearing loss, bilateral: Secondary | ICD-10-CM | POA: Diagnosis not present

## 2021-03-25 DIAGNOSIS — K069 Disorder of gingiva and edentulous alveolar ridge, unspecified: Secondary | ICD-10-CM | POA: Diagnosis not present

## 2021-03-25 DIAGNOSIS — H8109 Meniere's disease, unspecified ear: Secondary | ICD-10-CM | POA: Diagnosis not present

## 2021-03-25 DIAGNOSIS — R7303 Prediabetes: Secondary | ICD-10-CM | POA: Diagnosis not present

## 2021-03-25 DIAGNOSIS — R030 Elevated blood-pressure reading, without diagnosis of hypertension: Secondary | ICD-10-CM | POA: Diagnosis not present

## 2021-03-25 DIAGNOSIS — E669 Obesity, unspecified: Secondary | ICD-10-CM | POA: Diagnosis not present

## 2021-03-25 DIAGNOSIS — Z87891 Personal history of nicotine dependence: Secondary | ICD-10-CM | POA: Diagnosis not present

## 2021-03-25 DIAGNOSIS — Z7984 Long term (current) use of oral hypoglycemic drugs: Secondary | ICD-10-CM | POA: Diagnosis not present

## 2021-03-25 DIAGNOSIS — E785 Hyperlipidemia, unspecified: Secondary | ICD-10-CM | POA: Diagnosis not present

## 2021-05-26 DIAGNOSIS — H524 Presbyopia: Secondary | ICD-10-CM | POA: Diagnosis not present

## 2021-05-26 DIAGNOSIS — E119 Type 2 diabetes mellitus without complications: Secondary | ICD-10-CM | POA: Diagnosis not present

## 2021-05-26 DIAGNOSIS — H52203 Unspecified astigmatism, bilateral: Secondary | ICD-10-CM | POA: Diagnosis not present

## 2021-05-26 DIAGNOSIS — H04123 Dry eye syndrome of bilateral lacrimal glands: Secondary | ICD-10-CM | POA: Diagnosis not present

## 2021-05-26 DIAGNOSIS — Z961 Presence of intraocular lens: Secondary | ICD-10-CM | POA: Diagnosis not present

## 2021-05-26 DIAGNOSIS — Z7984 Long term (current) use of oral hypoglycemic drugs: Secondary | ICD-10-CM | POA: Diagnosis not present

## 2021-06-13 DIAGNOSIS — E1169 Type 2 diabetes mellitus with other specified complication: Secondary | ICD-10-CM | POA: Diagnosis not present

## 2021-06-13 DIAGNOSIS — M1990 Unspecified osteoarthritis, unspecified site: Secondary | ICD-10-CM | POA: Diagnosis not present

## 2021-06-13 DIAGNOSIS — H8113 Benign paroxysmal vertigo, bilateral: Secondary | ICD-10-CM | POA: Diagnosis not present

## 2021-06-13 DIAGNOSIS — Z1389 Encounter for screening for other disorder: Secondary | ICD-10-CM | POA: Diagnosis not present

## 2021-06-13 DIAGNOSIS — Z0000 Encounter for general adult medical examination without abnormal findings: Secondary | ICD-10-CM | POA: Diagnosis not present

## 2021-06-13 DIAGNOSIS — E782 Mixed hyperlipidemia: Secondary | ICD-10-CM | POA: Diagnosis not present

## 2021-06-13 DIAGNOSIS — E559 Vitamin D deficiency, unspecified: Secondary | ICD-10-CM | POA: Diagnosis not present

## 2021-06-13 DIAGNOSIS — K589 Irritable bowel syndrome without diarrhea: Secondary | ICD-10-CM | POA: Diagnosis not present

## 2021-06-13 DIAGNOSIS — J309 Allergic rhinitis, unspecified: Secondary | ICD-10-CM | POA: Diagnosis not present

## 2021-09-09 DIAGNOSIS — H8101 Meniere's disease, right ear: Secondary | ICD-10-CM | POA: Diagnosis not present

## 2021-09-09 DIAGNOSIS — H903 Sensorineural hearing loss, bilateral: Secondary | ICD-10-CM | POA: Diagnosis not present

## 2021-09-09 DIAGNOSIS — H6123 Impacted cerumen, bilateral: Secondary | ICD-10-CM | POA: Diagnosis not present

## 2021-09-09 DIAGNOSIS — R42 Dizziness and giddiness: Secondary | ICD-10-CM | POA: Diagnosis not present

## 2021-10-30 DIAGNOSIS — H60502 Unspecified acute noninfective otitis externa, left ear: Secondary | ICD-10-CM | POA: Diagnosis not present

## 2021-11-19 DIAGNOSIS — H1132 Conjunctival hemorrhage, left eye: Secondary | ICD-10-CM | POA: Diagnosis not present

## 2021-12-15 DIAGNOSIS — E7800 Pure hypercholesterolemia, unspecified: Secondary | ICD-10-CM | POA: Diagnosis not present

## 2021-12-15 DIAGNOSIS — H8113 Benign paroxysmal vertigo, bilateral: Secondary | ICD-10-CM | POA: Diagnosis not present

## 2021-12-15 DIAGNOSIS — E1169 Type 2 diabetes mellitus with other specified complication: Secondary | ICD-10-CM | POA: Diagnosis not present

## 2021-12-15 DIAGNOSIS — E782 Mixed hyperlipidemia: Secondary | ICD-10-CM | POA: Diagnosis not present

## 2021-12-15 DIAGNOSIS — R600 Localized edema: Secondary | ICD-10-CM | POA: Diagnosis not present

## 2021-12-15 DIAGNOSIS — H539 Unspecified visual disturbance: Secondary | ICD-10-CM | POA: Diagnosis not present

## 2021-12-25 ENCOUNTER — Emergency Department (HOSPITAL_COMMUNITY)
Admission: EM | Admit: 2021-12-25 | Discharge: 2021-12-25 | Disposition: A | Discharge status: Home / Self Care | Payer: Medicare PPO | Attending: Emergency Medicine | Admitting: Emergency Medicine

## 2021-12-25 ENCOUNTER — Encounter (HOSPITAL_COMMUNITY): Payer: Self-pay

## 2021-12-25 ENCOUNTER — Other Ambulatory Visit: Payer: Self-pay

## 2021-12-25 DIAGNOSIS — H0015 Chalazion left lower eyelid: Secondary | ICD-10-CM | POA: Diagnosis not present

## 2021-12-25 DIAGNOSIS — D23122 Other benign neoplasm of skin of left lower eyelid, including canthus: Secondary | ICD-10-CM | POA: Insufficient documentation

## 2021-12-25 DIAGNOSIS — D492 Neoplasm of unspecified behavior of bone, soft tissue, and skin: Secondary | ICD-10-CM

## 2021-12-25 DIAGNOSIS — H00015 Hordeolum externum left lower eyelid: Secondary | ICD-10-CM | POA: Diagnosis not present

## 2021-12-25 MED ORDER — FLUORESCEIN SODIUM 1 MG OP STRP
1.0000 | ORAL_STRIP | Freq: Once | OPHTHALMIC | Status: AC
  Administered 2021-12-25: 1 via OPHTHALMIC
  Filled 2021-12-25: qty 1

## 2021-12-25 MED ORDER — TETRACAINE HCL 0.5 % OP SOLN
2.0000 [drp] | Freq: Once | OPHTHALMIC | Status: AC
  Administered 2021-12-25: 2 [drp] via OPHTHALMIC
  Filled 2021-12-25: qty 4

## 2021-12-25 NOTE — Discharge Instructions (Signed)
Go to Dr. Kellie Moor office today for an appointment at 1:30 PM.  Return to the ED with new or worsening symptoms

## 2021-12-25 NOTE — ED Triage Notes (Signed)
Patient c/o a growth on the left lower eyelid. Patient states she has had  a total of 4 in the past month.

## 2021-12-25 NOTE — ED Provider Notes (Signed)
[facility] DEPT Provider Note   CSN: [id number] Arrival date & time: 12/25/21  1051     History  No chief complaint on file.   Martha Lara is a 81 y.o. female.  Patient presents with growth to her left lower eyelid.  She denies any trauma.  States has been coming and going for the past 1 month.  She saw her ophthalmologist Dr. Gershon Crane after having a subconjunctival hemorrhage a month ago.  She is not having this issue at this time.  She reports a growth to her left lower eyelid that comes and goes lasting for several hours to days at a time.  She denies any visual changes.  States her vision is the same as baseline.  Denies any headache.  No chest pain or shortness of breath.  No cough or fever.  No difficulty breathing or difficulty swallowing. States she is able to fully growth back inside of her eyelid but it continues to come out and she is concerned about it. Dr. Gershon Crane has not seen this.  She did make her PCP aware of it and was referred to a different ophthalmologist which she was not able to get in contact with.  The history is provided by the patient and a relative.       Home Medications Prior to Admission medications   Medication Sig Start Date End Date Taking? Authorizing Provider  albuterol (PROVENTIL HFA;VENTOLIN HFA) 108 (90 Base) MCG/ACT inhaler Inhale 2 puffs into the lungs every 4 (four) hours as needed for wheezing or shortness of breath (cough, shortness of breath or wheezing.). 01/11/18   Tenna Delaine D, PA-C  cetirizine (ZYRTEC) 10 MG tablet Take 10 mg by mouth daily.    [provider]  meclizine (ANTIVERT) 12.5 MG tablet Take 12.5 mg by mouth daily.     [provider]  metFORMIN (GLUCOPHAGE) 500 MG tablet 1 TABLET WITH EVENING MEAL ORALLY 90 10/29/17   [provider]      Allergies    Patient has no known allergies.    Review of Systems   Review of Systems  Constitutional:  Negative  for activity change, appetite change and fever.  Eyes:  Positive for pain and itching. Negative for photophobia and visual disturbance.  Respiratory:  Negative for cough and shortness of breath.   Cardiovascular:  Negative for chest pain.  Gastrointestinal:  Negative for abdominal pain, nausea and vomiting.  Genitourinary:  Negative for dysuria and hematuria.  Musculoskeletal:  Negative for arthralgias and myalgias.  Skin:  Negative for rash.   all other systems are negative except as noted in the HPI and PMH.    Physical Exam Updated Vital Signs BP 135/81 (BP Location: Left Arm)   Pulse 80   Temp 98.3 F (36.8 C) (Oral)   Resp 18   Ht '5\' 5"'$  (1.651 m)   Wt 89.8 kg   SpO2 100%   BMI 32.95 kg/m  Physical Exam Vitals and nursing note reviewed.  Constitutional:      General: She is not in acute distress.    Appearance: She is well-developed.  HENT:     Head: Normocephalic and atraumatic.     Mouth/Throat:     Pharynx: No oropharyngeal exudate.  Eyes:     Conjunctiva/sclera: Conjunctivae normal.     Pupils: Pupils are equal, round, and reactive to light.     Comments: Growth to L lower lid conjunctiva. No gross hyphema or hypopyon.  Extraocular movements  are intact.   Neck:     Comments: No meningismus. Cardiovascular:     Rate and Rhythm: Normal rate and regular rhythm.     Heart sounds: Normal heart sounds. No murmur heard. Pulmonary:     Effort: Pulmonary effort is normal. No respiratory distress.     Breath sounds: Normal breath sounds.  Abdominal:     Palpations: Abdomen is soft.     Tenderness: There is no abdominal tenderness. There is no guarding or rebound.  Musculoskeletal:        General: No tenderness. Normal range of motion.     Cervical back: Normal range of motion and neck supple.  Skin:    General: Skin is warm.  Neurological:     Mental Status: She is alert and oriented to person, place, and time.     Cranial Nerves: No cranial nerve deficit.      Motor: No abnormal muscle tone.     Coordination: Coordination normal.     Comments: Moves all extremities normally. Alert and interactive.   Psychiatric:        Behavior: Behavior normal.     ED Results / Procedures / Treatments   Labs (all labs ordered are listed, but only abnormal results are displayed) Labs Reviewed - No data to display  EKG None  Radiology No results found.  Procedures Procedures    Medications Ordered in ED Medications  fluorescein ophthalmic strip 1 strip (has no administration in time range)  tetracaine (PONTOCAINE) 0.5 % ophthalmic solution 2 drop (has no administration in time range)    ED Course/ Medical Decision Making/ A&P                           Medical Decision Making Risk Prescription drug management.  Growth the left lower eyelid for the past 1 month of uncertain etiology that is coming and going.  No visual changes.  No headache.  No dizziness or lightheadedness.  No focal weakness, numbness or tingling.  Discussed with Dr. Gershon Crane.  He agrees to see patient in the office today at 1:30 PM.  Discussed with patient and husband.  They are agreeable to go to Dr. Kellie Moor office directly from the ED.         Final Clinical Impression(s) / ED Diagnoses Final diagnoses:  Growth of eyelid    Rx / DC Orders ED Discharge Orders     None         Johnita Palleschi, Annie Main, MD 12/25/21 1515

## 2022-01-01 DIAGNOSIS — E669 Obesity, unspecified: Secondary | ICD-10-CM | POA: Diagnosis not present

## 2022-01-01 DIAGNOSIS — R42 Dizziness and giddiness: Secondary | ICD-10-CM | POA: Diagnosis not present

## 2022-01-01 DIAGNOSIS — Z809 Family history of malignant neoplasm, unspecified: Secondary | ICD-10-CM | POA: Diagnosis not present

## 2022-01-01 DIAGNOSIS — E785 Hyperlipidemia, unspecified: Secondary | ICD-10-CM | POA: Diagnosis not present

## 2022-01-01 DIAGNOSIS — Z7984 Long term (current) use of oral hypoglycemic drugs: Secondary | ICD-10-CM | POA: Diagnosis not present

## 2022-01-01 DIAGNOSIS — Z6833 Body mass index (BMI) 33.0-33.9, adult: Secondary | ICD-10-CM | POA: Diagnosis not present

## 2022-01-01 DIAGNOSIS — Z87891 Personal history of nicotine dependence: Secondary | ICD-10-CM | POA: Diagnosis not present

## 2022-01-01 DIAGNOSIS — E119 Type 2 diabetes mellitus without complications: Secondary | ICD-10-CM | POA: Diagnosis not present

## 2022-01-01 DIAGNOSIS — R030 Elevated blood-pressure reading, without diagnosis of hypertension: Secondary | ICD-10-CM | POA: Diagnosis not present

## 2022-02-12 ENCOUNTER — Other Ambulatory Visit: Payer: Self-pay | Admitting: Internal Medicine

## 2022-02-12 DIAGNOSIS — Z1231 Encounter for screening mammogram for malignant neoplasm of breast: Secondary | ICD-10-CM

## 2022-02-16 ENCOUNTER — Ambulatory Visit: Payer: Medicare PPO

## 2022-02-16 DIAGNOSIS — L659 Nonscarring hair loss, unspecified: Secondary | ICD-10-CM | POA: Diagnosis not present

## 2022-03-11 DIAGNOSIS — H6123 Impacted cerumen, bilateral: Secondary | ICD-10-CM | POA: Diagnosis not present

## 2022-03-11 DIAGNOSIS — R42 Dizziness and giddiness: Secondary | ICD-10-CM | POA: Diagnosis not present

## 2022-03-11 DIAGNOSIS — H903 Sensorineural hearing loss, bilateral: Secondary | ICD-10-CM | POA: Diagnosis not present

## 2022-03-16 ENCOUNTER — Ambulatory Visit: Payer: Medicare PPO

## 2022-03-27 ENCOUNTER — Ambulatory Visit
Admission: RE | Admit: 2022-03-27 | Discharge: 2022-03-27 | Disposition: A | Discharge status: Home / Self Care | Payer: Medicare PPO | Source: Ambulatory Visit | Attending: Internal Medicine | Admitting: Internal Medicine

## 2022-03-27 DIAGNOSIS — Z1231 Encounter for screening mammogram for malignant neoplasm of breast: Secondary | ICD-10-CM

## 2022-03-30 ENCOUNTER — Other Ambulatory Visit: Payer: Self-pay | Admitting: Internal Medicine

## 2022-03-30 DIAGNOSIS — R928 Other abnormal and inconclusive findings on diagnostic imaging of breast: Secondary | ICD-10-CM

## 2022-04-10 ENCOUNTER — Ambulatory Visit: Payer: Medicare PPO

## 2022-04-10 ENCOUNTER — Ambulatory Visit
Admission: RE | Admit: 2022-04-10 | Discharge: 2022-04-10 | Disposition: A | Discharge status: Home / Self Care | Payer: Medicare PPO | Source: Ambulatory Visit | Attending: Internal Medicine | Admitting: Internal Medicine

## 2022-04-10 DIAGNOSIS — R928 Other abnormal and inconclusive findings on diagnostic imaging of breast: Secondary | ICD-10-CM

## 2022-04-10 DIAGNOSIS — R922 Inconclusive mammogram: Secondary | ICD-10-CM | POA: Diagnosis not present

## 2022-06-16 DIAGNOSIS — E559 Vitamin D deficiency, unspecified: Secondary | ICD-10-CM | POA: Diagnosis not present

## 2022-06-16 DIAGNOSIS — R600 Localized edema: Secondary | ICD-10-CM | POA: Diagnosis not present

## 2022-06-16 DIAGNOSIS — M179 Osteoarthritis of knee, unspecified: Secondary | ICD-10-CM | POA: Diagnosis not present

## 2022-06-16 DIAGNOSIS — E1169 Type 2 diabetes mellitus with other specified complication: Secondary | ICD-10-CM | POA: Diagnosis not present

## 2022-06-16 DIAGNOSIS — K589 Irritable bowel syndrome without diarrhea: Secondary | ICD-10-CM | POA: Diagnosis not present

## 2022-06-16 DIAGNOSIS — Z1331 Encounter for screening for depression: Secondary | ICD-10-CM | POA: Diagnosis not present

## 2022-06-16 DIAGNOSIS — Z0000 Encounter for general adult medical examination without abnormal findings: Secondary | ICD-10-CM | POA: Diagnosis not present

## 2022-06-16 DIAGNOSIS — Z7984 Long term (current) use of oral hypoglycemic drugs: Secondary | ICD-10-CM | POA: Diagnosis not present

## 2022-06-16 DIAGNOSIS — H8113 Benign paroxysmal vertigo, bilateral: Secondary | ICD-10-CM | POA: Diagnosis not present

## 2022-06-16 DIAGNOSIS — E782 Mixed hyperlipidemia: Secondary | ICD-10-CM | POA: Diagnosis not present

## 2022-07-20 DIAGNOSIS — H52203 Unspecified astigmatism, bilateral: Secondary | ICD-10-CM | POA: Diagnosis not present

## 2022-07-20 DIAGNOSIS — H524 Presbyopia: Secondary | ICD-10-CM | POA: Diagnosis not present

## 2022-07-20 DIAGNOSIS — E119 Type 2 diabetes mellitus without complications: Secondary | ICD-10-CM | POA: Diagnosis not present

## 2022-07-20 DIAGNOSIS — Z961 Presence of intraocular lens: Secondary | ICD-10-CM | POA: Diagnosis not present

## 2022-09-08 DIAGNOSIS — R42 Dizziness and giddiness: Secondary | ICD-10-CM | POA: Diagnosis not present

## 2022-09-08 DIAGNOSIS — H6123 Impacted cerumen, bilateral: Secondary | ICD-10-CM | POA: Diagnosis not present

## 2022-09-08 DIAGNOSIS — R0982 Postnasal drip: Secondary | ICD-10-CM | POA: Diagnosis not present

## 2022-09-08 DIAGNOSIS — H903 Sensorineural hearing loss, bilateral: Secondary | ICD-10-CM | POA: Diagnosis not present

## 2022-09-08 DIAGNOSIS — J310 Chronic rhinitis: Secondary | ICD-10-CM | POA: Diagnosis not present

## 2022-09-08 DIAGNOSIS — J343 Hypertrophy of nasal turbinates: Secondary | ICD-10-CM | POA: Diagnosis not present

## 2022-11-04 DIAGNOSIS — J343 Hypertrophy of nasal turbinates: Secondary | ICD-10-CM | POA: Diagnosis not present

## 2022-11-04 DIAGNOSIS — R0982 Postnasal drip: Secondary | ICD-10-CM | POA: Diagnosis not present

## 2022-11-04 DIAGNOSIS — H66012 Acute suppurative otitis media with spontaneous rupture of ear drum, left ear: Secondary | ICD-10-CM | POA: Diagnosis not present

## 2022-11-04 DIAGNOSIS — J310 Chronic rhinitis: Secondary | ICD-10-CM | POA: Diagnosis not present

## 2022-11-25 DIAGNOSIS — H66012 Acute suppurative otitis media with spontaneous rupture of ear drum, left ear: Secondary | ICD-10-CM | POA: Diagnosis not present

## 2022-11-25 DIAGNOSIS — H903 Sensorineural hearing loss, bilateral: Secondary | ICD-10-CM | POA: Diagnosis not present

## 2022-12-10 ENCOUNTER — Emergency Department (HOSPITAL_COMMUNITY): Payer: Medicare PPO

## 2022-12-10 ENCOUNTER — Other Ambulatory Visit: Payer: Self-pay

## 2022-12-10 ENCOUNTER — Encounter (HOSPITAL_COMMUNITY): Payer: Self-pay | Admitting: Emergency Medicine

## 2022-12-10 ENCOUNTER — Emergency Department (HOSPITAL_COMMUNITY)
Admission: EM | Admit: 2022-12-10 | Discharge: 2022-12-10 | Disposition: A | Discharge status: Home / Self Care | Payer: Medicare PPO | Attending: Emergency Medicine | Admitting: Emergency Medicine

## 2022-12-10 DIAGNOSIS — R519 Headache, unspecified: Secondary | ICD-10-CM | POA: Diagnosis not present

## 2022-12-10 DIAGNOSIS — W010XXA Fall on same level from slipping, tripping and stumbling without subsequent striking against object, initial encounter: Secondary | ICD-10-CM | POA: Diagnosis not present

## 2022-12-10 DIAGNOSIS — W19XXXA Unspecified fall, initial encounter: Secondary | ICD-10-CM

## 2022-12-10 DIAGNOSIS — M542 Cervicalgia: Secondary | ICD-10-CM | POA: Diagnosis not present

## 2022-12-10 DIAGNOSIS — S0181XA Laceration without foreign body of other part of head, initial encounter: Secondary | ICD-10-CM | POA: Insufficient documentation

## 2022-12-10 DIAGNOSIS — S0990XA Unspecified injury of head, initial encounter: Secondary | ICD-10-CM | POA: Diagnosis present

## 2022-12-10 MED ORDER — LIDOCAINE HCL (PF) 1 % IJ SOLN
5.0000 mL | Freq: Once | INTRAMUSCULAR | Status: AC
  Administered 2022-12-10: 5 mL
  Filled 2022-12-10: qty 5

## 2022-12-10 MED ORDER — ACETAMINOPHEN 325 MG PO TABS
650.0000 mg | ORAL_TABLET | Freq: Once | ORAL | Status: AC
  Administered 2022-12-10: 650 mg via ORAL
  Filled 2022-12-10: qty 2

## 2022-12-10 NOTE — Discharge Instructions (Signed)
You had 5 stitches placed to your left side of the forehead, you will need to have these 5 stitches removed within 5 to 7 days.  You may apply bacitracin or Neosporin to the wound to help with healing.  If you experience any headache, worsening symptoms please return to the emergency department.

## 2022-12-10 NOTE — ED Provider Notes (Signed)
EMERGENCY DEPARTMENT AT [facility] Provider Note   CSN: [id number] Arrival date & time: 12/10/22  1910     History  Chief Complaint  Patient presents with   [facility] Laceration    Martha Lara is a 82 y.o. female.  82 year old female with a past medical history of Vertigo, [place] presents to the ED s/p fall.  Patient reports she was trying to chase her cat when suddenly she slipped on the wet grass reports she struck her forehead up the hill.  States that she primarily fell and impact was received by the left side of her head.  She reports she did not lose consciousness.  Patient was able to stand up, and ambulated back into her house. She endorses pain along the left side of her head. Last tetanus immunization was in 2018. No chest pain, no dizziness, or other complaints.   The history is provided by the patient.  Fall This is a new problem. The current episode started 1 to 2 hours ago. Associated symptoms include headaches.  Head Laceration Associated symptoms include headaches.       Home Medications Prior to Admission medications   Medication Sig Start Date End Date Taking? Authorizing Provider  cetirizine (ZYRTEC) 10 MG tablet Take 10 mg by mouth daily.   Yes [provider]  fluticasone (FLONASE) 50 MCG/ACT nasal spray Place 1 spray into both nostrils daily.   Yes [provider]  furosemide (LASIX) 20 MG tablet Take 20 mg by mouth daily as needed for fluid or edema.   Yes [provider]  meclizine (ANTIVERT) 12.5 MG tablet Take 12.5 mg by mouth daily as needed for dizziness or nausea.   Yes [provider]  meloxicam (MOBIC) 15 MG tablet Take 15 mg by mouth daily.   Yes [provider]  metFORMIN (GLUCOPHAGE) 500 MG tablet Take 500 mg by mouth daily with breakfast. 10/29/17  Yes [provider]  psyllium (REGULOID) 0.52 g capsule Take 3 capsules by mouth daily.   Yes [provider]  albuterol (PROVENTIL HFA;VENTOLIN HFA) 108 (90 Base) MCG/ACT inhaler Inhale 2 puffs into the lungs every 4 (four) hours as needed for wheezing or shortness of breath (cough, shortness of breath or wheezing.). Patient not taking: Reported on 12/10/2022 01/11/18   Benjiman Core D, PA-C      Allergies    Patient has no known allergies.    Review of Systems   Review of Systems  Constitutional:  Negative for chills and fever.  Neurological:  Positive for headaches.  All other systems reviewed and are negative.   Physical Exam Updated Vital Signs BP 139/85 (BP Location: Right Arm)   Pulse 63   Temp 97.9 F (36.6 C) (Oral)   Resp 17   Ht 5\' 5"  (1.651 m)   Wt 90 kg   SpO2 100%   BMI 33.02 kg/m  Physical Exam Vitals and nursing note reviewed.  Constitutional:      Appearance: Normal appearance.  HENT:     Head: Normocephalic.     Comments: Laceration to the left side of the forehead.     Mouth/Throat:     Mouth: Mucous membranes are moist.  Cardiovascular:     Rate and Rhythm: Normal rate.  Pulmonary:     Effort: Pulmonary effort is normal.     Breath sounds: No wheezing or rales.  Abdominal:     General: Abdomen is flat.  Palpations: Abdomen is soft.  Musculoskeletal:     Cervical back: Normal range of motion and neck supple.  Skin:    General: Skin is warm and dry.  Neurological:     Mental Status: She is alert and oriented to person, place, and time.     Comments: Alert, oriented, thought content appropriate. Speech fluent without evidence of aphasia. Able to follow 2 step commands without difficulty.  Cranial Nerves:  II:  Peripheral visual fields grossly normal, pupils, round, reactive to light III,IV, VI: ptosis not present, extra-ocular motions intact bilaterally  V,VII: smile symmetric, facial light touch sensation equal VIII: hearing grossly normal bilaterally  IX,X: midline uvula rise  XI: bilateral shoulder shrug equal and strong XII:  midline tongue extension  Motor:  5/5 in upper and lower extremities bilaterally including strong and equal grip strength and dorsiflexion/plantar flexion Sensory: light touch normal in all extremities.  Cerebellar: normal finger-to-nose with bilateral upper extremities, pronator drift negative Gait: N/A       ED Results / Procedures / Treatments   Labs (all labs ordered are listed, but only abnormal results are displayed) Labs Reviewed - No data to display  EKG None  Radiology CT Head Wo Contrast  Result Date: 12/10/2022 CLINICAL DATA:  Recent fall with headaches and neck pain, initial encounter EXAM: CT HEAD WITHOUT CONTRAST CT CERVICAL SPINE WITHOUT CONTRAST TECHNIQUE: Multidetector CT imaging of the head and cervical spine was performed following the standard protocol without intravenous contrast. Multiplanar CT image reconstructions of the cervical spine were also generated. RADIATION DOSE REDUCTION: This exam was performed according to the departmental dose-optimization program which includes automated exposure control, adjustment of the mA and/or kV according to patient size and/or use of iterative reconstruction technique. COMPARISON:  None Available. FINDINGS: CT HEAD FINDINGS Brain: No evidence of acute infarction, hemorrhage, hydrocephalus, extra-axial collection or mass lesion/mass effect. Vascular: No hyperdense vessel or unexpected calcification. Skull: Normal. Negative for fracture or focal lesion. Sinuses/Orbits: No acute finding. Other: None. CT CERVICAL SPINE FINDINGS Alignment: Within normal limits. Skull base and vertebrae: 7 cervical segments are well visualized. Vertebral body height is well maintained. Osteophytic change and facet hypertrophic changes are noted throughout the cervical spine. No acute fracture or acute facet abnormality is noted. The odontoid is within normal limits. Soft tissues and spinal canal: Surrounding soft tissue structures are within normal  limits. Upper chest: Visualized lung apices are unremarkable. Other: None IMPRESSION: CT of the head: No acute intracranial abnormality noted. CT of the cervical spine: Multilevel degenerative change without acute abnormality. Electronically Signed   By: Alcide Clever M.D.   On: 12/10/2022 21:31   CT Cervical Spine Wo Contrast  Result Date: 12/10/2022 CLINICAL DATA:  Recent fall with headaches and neck pain, initial encounter EXAM: CT HEAD WITHOUT CONTRAST CT CERVICAL SPINE WITHOUT CONTRAST TECHNIQUE: Multidetector CT imaging of the head and cervical spine was performed following the standard protocol without intravenous contrast. Multiplanar CT image reconstructions of the cervical spine were also generated. RADIATION DOSE REDUCTION: This exam was performed according to the departmental dose-optimization program which includes automated exposure control, adjustment of the mA and/or kV according to patient size and/or use of iterative reconstruction technique. COMPARISON:  None Available. FINDINGS: CT HEAD FINDINGS Brain: No evidence of acute infarction, hemorrhage, hydrocephalus, extra-axial collection or mass lesion/mass effect. Vascular: No hyperdense vessel or unexpected calcification. Skull: Normal. Negative for fracture or focal lesion. Sinuses/Orbits: No acute finding. Other: None. CT CERVICAL SPINE FINDINGS Alignment: Within normal limits.  Skull base and vertebrae: 7 cervical segments are well visualized. Vertebral body height is well maintained. Osteophytic change and facet hypertrophic changes are noted throughout the cervical spine. No acute fracture or acute facet abnormality is noted. The odontoid is within normal limits. Soft tissues and spinal canal: Surrounding soft tissue structures are within normal limits. Upper chest: Visualized lung apices are unremarkable. Other: None IMPRESSION: CT of the head: No acute intracranial abnormality noted. CT of the cervical spine: Multilevel degenerative change  without acute abnormality. Electronically Signed   By: Alcide Clever M.D.   On: 12/10/2022 21:31    Procedures .Marland KitchenLaceration Repair  Date/Time: 12/10/2022 10:48 PM  Performed by: Claude Manges, PA-C Authorized by: Claude Manges, PA-C   Consent:    Consent obtained:  Verbal   Consent given by:  Patient   Risks discussed:  Infection Universal protocol:    Patient identity confirmed:  Verbally with patient Anesthesia:    Anesthesia method:  Local infiltration   Local anesthetic:  Lidocaine 1% w/o epi Laceration details:    Location:  Face   Face location:  Forehead   Length (cm):  3   Depth (mm):  1 Treatment:    Area cleansed with:  Saline   Amount of cleaning:  Extensive Skin repair:    Repair method:  Sutures   Suture size:  5-0   Suture material:  Prolene   Suture technique:  Simple interrupted   Number of sutures:  5 Approximation:    Approximation:  Close Repair type:    Repair type:  Simple Post-procedure details:    Dressing:  Open (no dressing)   Procedure completion:  Tolerated well, no immediate complications     Medications Ordered in ED Medications  acetaminophen (TYLENOL) tablet 650 mg (650 mg Oral Given 12/10/22 2126)  lidocaine (PF) (XYLOCAINE) 1 % injection 5 mL (5 mLs Infiltration Given 12/10/22 2229)    ED Course/ Medical Decision Making/ A&P                             Medical Decision Making Amount and/or Complexity of Data Reviewed Radiology: ordered.  Risk OTC drugs. Prescription drug management.   Patient presents to the ED status post mechanical fall while trying to chase her cat outside. No LOC. Good neuro exam, ambulated at the scene and ambulatory here. Vitals are within normal limits.  She is overall hemodynamically stable, large 3 cm laceration noted to the left side of her forehead.  Imaging of her CT head, CT cervical spine are within normal limits.  She does not have any other visible hematoma, or complaints of any other pain.  Given  Tylenol to help with pain control.  CT Head/Cervical spine: CT of the head: No acute intracranial abnormality noted.    CT of the cervical spine: Multilevel degenerative change without  acute abnormality.   I discussed these results with patient, we did repair her laceration with 5-0 Prolene, but she had a total of 5 stitches placed, this will need to be removed in 5 to 7 days.  Patient is hemodynamically stable.   Portions of this note were generated with Scientist, clinical (histocompatibility and immunogenetics). Dictation errors may occur despite best attempts at proofreading.   Final Clinical Impression(s) / ED Diagnoses Final diagnoses:  Fall, initial encounter  Facial laceration, initial encounter    Rx / DC Orders ED Discharge Orders     None  Claude Manges, PA-C 12/10/22 2254    Gwyneth Sprout, MD 12/10/22 2337

## 2022-12-10 NOTE — ED Triage Notes (Signed)
Patient had a mechanical fall this evening and hit her head.  Patient has an open laceration to the left side of her forehead.  Patient denies LOC.  Patient denies being on blood thinners.

## 2022-12-10 NOTE — ED Notes (Signed)
Irrigated patients laceration, placed wet to dry dressing on patients wound. Bleeding controlled at this time. Patient denies other need.

## 2022-12-15 DIAGNOSIS — E1169 Type 2 diabetes mellitus with other specified complication: Secondary | ICD-10-CM | POA: Diagnosis not present

## 2022-12-15 DIAGNOSIS — Z9181 History of falling: Secondary | ICD-10-CM | POA: Diagnosis not present

## 2022-12-15 DIAGNOSIS — E119 Type 2 diabetes mellitus without complications: Secondary | ICD-10-CM | POA: Diagnosis not present

## 2022-12-15 DIAGNOSIS — S0181XA Laceration without foreign body of other part of head, initial encounter: Secondary | ICD-10-CM | POA: Diagnosis not present

## 2022-12-15 DIAGNOSIS — M1990 Unspecified osteoarthritis, unspecified site: Secondary | ICD-10-CM | POA: Diagnosis not present

## 2022-12-15 DIAGNOSIS — E782 Mixed hyperlipidemia: Secondary | ICD-10-CM | POA: Diagnosis not present

## 2022-12-15 DIAGNOSIS — Z4802 Encounter for removal of sutures: Secondary | ICD-10-CM | POA: Diagnosis not present

## 2023-05-07 DIAGNOSIS — H353132 Nonexudative age-related macular degeneration, bilateral, intermediate dry stage: Secondary | ICD-10-CM | POA: Diagnosis not present

## 2023-05-14 ENCOUNTER — Other Ambulatory Visit: Payer: Self-pay | Admitting: Internal Medicine

## 2023-05-14 DIAGNOSIS — E559 Vitamin D deficiency, unspecified: Secondary | ICD-10-CM | POA: Diagnosis not present

## 2023-05-14 DIAGNOSIS — Z0000 Encounter for general adult medical examination without abnormal findings: Secondary | ICD-10-CM | POA: Diagnosis not present

## 2023-05-14 DIAGNOSIS — Z1239 Encounter for other screening for malignant neoplasm of breast: Secondary | ICD-10-CM | POA: Diagnosis not present

## 2023-05-14 DIAGNOSIS — R600 Localized edema: Secondary | ICD-10-CM | POA: Diagnosis not present

## 2023-05-14 DIAGNOSIS — K589 Irritable bowel syndrome without diarrhea: Secondary | ICD-10-CM | POA: Diagnosis not present

## 2023-05-14 DIAGNOSIS — D649 Anemia, unspecified: Secondary | ICD-10-CM | POA: Diagnosis not present

## 2023-05-14 DIAGNOSIS — E1169 Type 2 diabetes mellitus with other specified complication: Secondary | ICD-10-CM | POA: Diagnosis not present

## 2023-05-14 DIAGNOSIS — E2839 Other primary ovarian failure: Secondary | ICD-10-CM

## 2023-05-14 DIAGNOSIS — E119 Type 2 diabetes mellitus without complications: Secondary | ICD-10-CM | POA: Diagnosis not present

## 2023-05-14 DIAGNOSIS — M1990 Unspecified osteoarthritis, unspecified site: Secondary | ICD-10-CM | POA: Diagnosis not present

## 2023-05-14 DIAGNOSIS — Z1231 Encounter for screening mammogram for malignant neoplasm of breast: Secondary | ICD-10-CM

## 2023-05-14 DIAGNOSIS — E782 Mixed hyperlipidemia: Secondary | ICD-10-CM | POA: Diagnosis not present

## 2023-06-01 ENCOUNTER — Ambulatory Visit (INDEPENDENT_AMBULATORY_CARE_PROVIDER_SITE_OTHER): Payer: Medicare PPO | Admitting: Otolaryngology

## 2023-06-01 ENCOUNTER — Encounter (INDEPENDENT_AMBULATORY_CARE_PROVIDER_SITE_OTHER): Payer: Self-pay

## 2023-06-01 VITALS — Ht 65.0 in | Wt 183.0 lb

## 2023-06-01 DIAGNOSIS — H8101 Meniere's disease, right ear: Secondary | ICD-10-CM | POA: Diagnosis not present

## 2023-06-01 DIAGNOSIS — R42 Dizziness and giddiness: Secondary | ICD-10-CM

## 2023-06-01 DIAGNOSIS — H6123 Impacted cerumen, bilateral: Secondary | ICD-10-CM

## 2023-06-01 DIAGNOSIS — H903 Sensorineural hearing loss, bilateral: Secondary | ICD-10-CM

## 2023-06-01 DIAGNOSIS — H608X3 Other otitis externa, bilateral: Secondary | ICD-10-CM

## 2023-06-02 ENCOUNTER — Ambulatory Visit: Payer: Medicare PPO | Admitting: Podiatry

## 2023-06-03 DIAGNOSIS — H6123 Impacted cerumen, bilateral: Secondary | ICD-10-CM | POA: Insufficient documentation

## 2023-06-03 DIAGNOSIS — R42 Dizziness and giddiness: Secondary | ICD-10-CM | POA: Insufficient documentation

## 2023-06-03 DIAGNOSIS — H903 Sensorineural hearing loss, bilateral: Secondary | ICD-10-CM | POA: Insufficient documentation

## 2023-06-03 DIAGNOSIS — H608X3 Other otitis externa, bilateral: Secondary | ICD-10-CM | POA: Insufficient documentation

## 2023-06-03 NOTE — Progress Notes (Signed)
Patient ID: Martha Lara, female   DOB: [birth year], 82 y.o.   MRN: [id number]  Follow-up: Right ear Mnire's disease, bilateral hearing loss, recurrent ear infections  HPI: The patient is an 82 year old female who returns today for her follow-up evaluation.  The patient has a history of recurrent dizziness, secondary to her right ear Mnire's disease.  She also has a history of bilateral high-frequency sensorineural hearing loss, eustachian tube dysfunction, and recurrent ear infections.  At her last visit in May 2024, her acute left ear infection had resolved.  She was treated with low-salt diet, Flonase nasal spray, and hearing aids.  The patient returns today reporting no significant change in her hearing.  She also denies any recent dizziness.  However, she complains of increasing itchy sensation in her ears.  She currently wears bilateral hearing aids.  Exam: General: Communicates without difficulty, well nourished, no acute distress. Head: Normocephalic, no evidence injury, no tenderness, facial buttresses intact without stepoff. Face/sinus: No tenderness to palpation and percussion. Facial movement is normal and symmetric. Eyes: PERRL, EOMI. No scleral icterus, conjunctivae clear. Neuro: CN II exam reveals vision grossly intact.  No nystagmus at any point of gaze. Ears: Auricles well formed without lesions.  Bilateral cerumen impaction.  Nose: External evaluation reveals normal support and skin without lesions.  Dorsum is intact.  Anterior rhinoscopy reveals congested mucosa over anterior aspect of inferior turbinates and intact septum.  No purulence noted. Oral:  Oral cavity and oropharynx are intact, symmetric, without erythema or edema.  Mucosa is moist without lesions. Neck: Full range of motion without pain.  There is no significant lymphadenopathy.  No masses palpable.  Thyroid bed within normal limits to palpation.  Parotid glands and submandibular glands equal bilaterally without mass.   Trachea is midline. Neuro:  CN 2-12 grossly intact. Vestibular: No nystagmus at any point of gaze. Vestibular: There is no nystagmus with pneumatic pressure on either tympanic membrane or Valsalva. The cerebellar examination is unremarkable.    Procedure: Bilateral cerumen disimpaction Anesthesia: None Description: Under the operating microscope, the cerumen is carefully removed with a combination of cerumen currette, alligator forceps, and suction catheters.  After the cerumen is removed, the TMs are noted to be normal.  Eczematous changes are noted within the ear canals.  No mass, erythema, or lesions. The patient tolerated the procedure well.    Assessment: 1.  Bilateral cerumen impaction.  After the disimpaction procedure, eczematous changes are noted within the ear canals.  Both tympanic membranes and middle ear spaces are noted to be normal. 2.  The patient's right ear Mnire's disease is currently under controlled with a low-salt diet. 3.  Subjectively stable bilateral sensorineural hearing loss.  Plan: 1.  Otomicroscopy with bilateral cerumen disimpaction. 2.  The physical exam findings are reviewed with the patient. 3.  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 4.  Continue with the 1500 mg low-salt diet. 5.  Continue the use of her hearing aids. 6.  The patient will return for reevaluation in 6 months.

## 2023-06-08 ENCOUNTER — Encounter: Payer: Self-pay | Admitting: Podiatry

## 2023-06-08 ENCOUNTER — Ambulatory Visit: Payer: Medicare PPO | Admitting: Podiatry

## 2023-06-08 DIAGNOSIS — M79674 Pain in right toe(s): Secondary | ICD-10-CM | POA: Diagnosis not present

## 2023-06-08 DIAGNOSIS — B351 Tinea unguium: Secondary | ICD-10-CM | POA: Diagnosis not present

## 2023-06-08 DIAGNOSIS — M79675 Pain in left toe(s): Secondary | ICD-10-CM

## 2023-06-08 DIAGNOSIS — E119 Type 2 diabetes mellitus without complications: Secondary | ICD-10-CM

## 2023-06-08 NOTE — Progress Notes (Signed)
  Subjective:  Patient ID: Martha Lara, female    DOB: July 15, 1940,   MRN: [id number]  Chief Complaint  Patient presents with   Nail Problem    dfc    82 y.o. female presents for concern of thickened elongated and painful nails that are difficult to trim. Requesting to have them trimmed today. Relates burning and tingling in their feet. Patient is diabetic and and has been on metformin. However does not recall last A1c.   PCP:  Georgann Housekeeper, MD    . Denies any other pedal complaints. Denies n/v/f/c.   Past Medical History:  Diagnosis Date   Meniere's disease    Vertigo     Objective:  Physical Exam: Vascular: DP/PT pulses 2/4 bilateral. CFT <3 seconds. Absent hair growth on digits. Edema noted to bilateral lower extremities. Xerosis noted bilaterally.  Skin. No lacerations or abrasions bilateral feet. Nails 1-5 bilateral  are thickened discolored and elongated with subungual debris. Hyperkeratotic lesion noted to lateral fifth digit on left  Musculoskeletal: MMT 5/5 bilateral lower extremities in DF, PF, Inversion and Eversion. Deceased ROM in DF of ankle joint.  Neurological: Sensation intact to light touch. Protective sensation intact bilateral.    Assessment:   1. Pain due to onychomycosis of toenails of both feet   2. Type 2 diabetes mellitus without complication, without long-term current use of insulin (HCC)      Plan:  Patient was evaluated and treated and all questions answered. -Discussed and educated patient on diabetic foot care, especially with  regards to the vascular, neurological and musculoskeletal systems.  -Stressed the importance of good glycemic control and the detriment of not  controlling glucose levels in relation to the foot. -Discussed supportive shoes at all times and checking feet regularly.  -Mechanically debrided all nails 1-5 bilateral using sterile nail nipper and filed with dremel without incident  -Answered all patient  questions -Patient to return  in 3 months for at risk foot care -Patient advised to call the office if any problems or questions arise in the meantime.   Louann Sjogren, DPM

## 2023-06-14 DIAGNOSIS — D649 Anemia, unspecified: Secondary | ICD-10-CM | POA: Diagnosis not present

## 2023-07-26 ENCOUNTER — Other Ambulatory Visit: Payer: Self-pay | Admitting: Internal Medicine

## 2023-07-26 DIAGNOSIS — E2839 Other primary ovarian failure: Secondary | ICD-10-CM

## 2023-08-18 ENCOUNTER — Ambulatory Visit
Admission: RE | Admit: 2023-08-18 | Discharge: 2023-08-18 | Disposition: A | Discharge status: Home / Self Care | Payer: Medicare PPO | Source: Ambulatory Visit | Attending: Internal Medicine | Admitting: Internal Medicine

## 2023-08-18 DIAGNOSIS — Z1231 Encounter for screening mammogram for malignant neoplasm of breast: Secondary | ICD-10-CM

## 2023-09-06 ENCOUNTER — Ambulatory Visit: Payer: Medicare PPO | Admitting: Podiatry

## 2023-09-06 ENCOUNTER — Encounter: Payer: Self-pay | Admitting: Podiatry

## 2023-09-06 DIAGNOSIS — B351 Tinea unguium: Secondary | ICD-10-CM

## 2023-09-06 DIAGNOSIS — M79674 Pain in right toe(s): Secondary | ICD-10-CM | POA: Diagnosis not present

## 2023-09-06 DIAGNOSIS — E119 Type 2 diabetes mellitus without complications: Secondary | ICD-10-CM

## 2023-09-06 DIAGNOSIS — M79675 Pain in left toe(s): Secondary | ICD-10-CM | POA: Diagnosis not present

## 2023-09-06 NOTE — Progress Notes (Signed)
  Subjective:  Patient ID: Martha Lara, female    DOB: [birth year],   MRN: [id number]  No chief complaint on file.   83 y.o. female presents for concern of thickened elongated and painful nails that are difficult to trim. Requesting to have them trimmed today. Relates burning and tingling in their feet. Patient is diabetic and and has been on metformin. However does not recall last A1c.   PCP:  Georgann Housekeeper, MD    . Denies any other pedal complaints. Denies n/v/f/c.   Past Medical History:  Diagnosis Date   Meniere's disease    Vertigo     Objective:  Physical Exam: Vascular: DP/PT pulses 2/4 bilateral. CFT <3 seconds. Absent hair growth on digits. Edema noted to bilateral lower extremities. Xerosis noted bilaterally.  Skin. No lacerations or abrasions bilateral feet. Nails 1-5 bilateral  are thickened discolored and elongated with subungual debris. Hyperkeratotic lesion noted to lateral fifth digit on left  Musculoskeletal: MMT 5/5 bilateral lower extremities in DF, PF, Inversion and Eversion. Deceased ROM in DF of ankle joint.  Neurological: Sensation intact to light touch. Protective sensation intact bilateral.    Assessment:   1. Pain due to onychomycosis of toenails of both feet   2. Type 2 diabetes mellitus without complication, without long-term current use of insulin (HCC)      Plan:  Patient was evaluated and treated and all questions answered. -Discussed and educated patient on diabetic foot care, especially with  regards to the vascular, neurological and musculoskeletal systems.  -Stressed the importance of good glycemic control and the detriment of not  controlling glucose levels in relation to the foot. -Discussed supportive shoes at all times and checking feet regularly.  -Mechanically debrided all nails 1-5 bilateral using sterile nail nipper and filed with dremel without incident  -Answered all patient questions -Patient to return  in 3 months for at  risk foot care -Patient advised to call the office if any problems or questions arise in the meantime.   Louann Sjogren, DPM

## 2023-11-10 DIAGNOSIS — H353122 Nonexudative age-related macular degeneration, left eye, intermediate dry stage: Secondary | ICD-10-CM | POA: Diagnosis not present

## 2023-11-12 DIAGNOSIS — E559 Vitamin D deficiency, unspecified: Secondary | ICD-10-CM | POA: Diagnosis not present

## 2023-11-12 DIAGNOSIS — E1169 Type 2 diabetes mellitus with other specified complication: Secondary | ICD-10-CM | POA: Diagnosis not present

## 2023-11-12 DIAGNOSIS — Z23 Encounter for immunization: Secondary | ICD-10-CM | POA: Diagnosis not present

## 2023-11-12 DIAGNOSIS — H8113 Benign paroxysmal vertigo, bilateral: Secondary | ICD-10-CM | POA: Diagnosis not present

## 2023-11-12 DIAGNOSIS — E7800 Pure hypercholesterolemia, unspecified: Secondary | ICD-10-CM | POA: Diagnosis not present

## 2023-11-12 DIAGNOSIS — H9190 Unspecified hearing loss, unspecified ear: Secondary | ICD-10-CM | POA: Diagnosis not present

## 2023-11-17 ENCOUNTER — Encounter (INDEPENDENT_AMBULATORY_CARE_PROVIDER_SITE_OTHER): Payer: Self-pay | Admitting: Otolaryngology

## 2023-11-17 ENCOUNTER — Ambulatory Visit (INDEPENDENT_AMBULATORY_CARE_PROVIDER_SITE_OTHER): Admitting: Otolaryngology

## 2023-11-17 VITALS — BP 155/76 | HR 79 | Ht 65.0 in | Wt 174.0 lb

## 2023-11-17 DIAGNOSIS — H903 Sensorineural hearing loss, bilateral: Secondary | ICD-10-CM

## 2023-11-17 DIAGNOSIS — H6123 Impacted cerumen, bilateral: Secondary | ICD-10-CM | POA: Diagnosis not present

## 2023-11-17 DIAGNOSIS — R42 Dizziness and giddiness: Secondary | ICD-10-CM

## 2023-11-18 NOTE — Progress Notes (Signed)
 Follow-up: Recurrent dizziness, right ear Mnire's disease, bilateral hearing loss, recurrent ear infections  HPI: The patient is an 83 year old female who returns today complaining of recurrent dizziness.  The patient was last seen in December 2024.  At that time, her right ear Mnire's disease was under control with a low-salt diet.  She was noted to have subjectively stable bilateral sensorineural hearing loss.  According to the patient, she has noted several dizziness episodes over the past 6 months.  She uses meclizine  as needed.  She is still on a low-salt diet.  In addition, she also complains of clogging and itchy sensation in her ears, worse on the left side.  She has a history of bilateral sensorineural hearing loss and recurrent ear infections.  Currently she denies any significant otalgia, otorrhea, recent change in her hearing, or vertigo.  Exam: General: Communicates without difficulty, well nourished, no acute distress. Head: Normocephalic, no evidence injury, no tenderness, facial buttresses intact without stepoff. Face/sinus: No tenderness to palpation and percussion. Facial movement is normal and symmetric. Eyes: PERRL, EOMI. No scleral icterus, conjunctivae clear. Neuro: CN II exam reveals vision grossly intact.  No nystagmus at any point of gaze. Ears: Auricles well formed without lesions.  Bilateral cerumen impaction.  Nose: External evaluation reveals normal support and skin without lesions.  Dorsum is intact.  Anterior rhinoscopy reveals congested mucosa over anterior aspect of inferior turbinates and intact septum.  No purulence noted. Oral:  Oral cavity and oropharynx are intact, symmetric, without erythema or edema.  Mucosa is moist without lesions. Neck: Full range of motion without pain.  There is no significant lymphadenopathy.  No masses palpable.  Thyroid  bed within normal limits to palpation.  Parotid glands and submandibular glands equal bilaterally without mass.  Trachea is  midline. Neuro:  CN 2-12 grossly intact. Vestibular: No nystagmus at any point of gaze. Vestibular: There is no nystagmus with pneumatic pressure on either tympanic membrane or Valsalva. The cerebellar examination is unremarkable.     Procedure: Bilateral cerumen disimpaction Anesthesia: None Description: Under the operating microscope, the cerumen is carefully removed with a combination of cerumen currette, alligator forceps, and suction catheters.  After the cerumen is removed, the TMs are noted to be normal.  No mass, erythema, or lesions. The patient tolerated the procedure well.     Assessment: 1.  Recurrent cerumen impaction.  After the disimpaction procedure, both tympanic membranes and middle ear spaces are noted to be normal. 2.  Recurrent dizziness, likely secondary to her right ear Mnire's disease. 3.  Subjectively stable bilateral sensorineural hearing loss.  Plan: 1.  Otomicroscopy with bilateral cerumen disimpaction. 2.  The physical exam findings are reviewed with the patient. 3.  The pathophysiology and clinical course of Mnire's disease are extensively discussed.  Questions are invited and answered. 4.  Continue with a 1500 mg low-salt diet. 5.  The option of starting a diuretic is also discussed. 6.  Continue the use of her hearing aids. 7.  The patient will return for reevaluation in 4 months.

## 2023-11-30 ENCOUNTER — Ambulatory Visit (INDEPENDENT_AMBULATORY_CARE_PROVIDER_SITE_OTHER): Payer: Medicare PPO | Admitting: Otolaryngology

## 2023-12-27 DIAGNOSIS — H90A31 Mixed conductive and sensorineural hearing loss, unilateral, right ear with restricted hearing on the contralateral side: Secondary | ICD-10-CM | POA: Diagnosis not present

## 2023-12-27 DIAGNOSIS — H90A22 Sensorineural hearing loss, unilateral, left ear, with restricted hearing on the contralateral side: Secondary | ICD-10-CM | POA: Diagnosis not present

## 2024-03-20 ENCOUNTER — Ambulatory Visit (INDEPENDENT_AMBULATORY_CARE_PROVIDER_SITE_OTHER): Admitting: Otolaryngology

## 2024-03-21 ENCOUNTER — Other Ambulatory Visit: Payer: Medicare PPO

## 2024-04-03 DIAGNOSIS — E119 Type 2 diabetes mellitus without complications: Secondary | ICD-10-CM | POA: Diagnosis not present

## 2024-04-04 DIAGNOSIS — Z1331 Encounter for screening for depression: Secondary | ICD-10-CM | POA: Diagnosis not present

## 2024-04-04 DIAGNOSIS — E1169 Type 2 diabetes mellitus with other specified complication: Secondary | ICD-10-CM | POA: Diagnosis not present

## 2024-04-04 DIAGNOSIS — H9190 Unspecified hearing loss, unspecified ear: Secondary | ICD-10-CM | POA: Diagnosis not present

## 2024-04-04 DIAGNOSIS — Z0000 Encounter for general adult medical examination without abnormal findings: Secondary | ICD-10-CM | POA: Diagnosis not present

## 2024-04-04 DIAGNOSIS — J309 Allergic rhinitis, unspecified: Secondary | ICD-10-CM | POA: Diagnosis not present

## 2024-04-04 DIAGNOSIS — K589 Irritable bowel syndrome without diarrhea: Secondary | ICD-10-CM | POA: Diagnosis not present

## 2024-04-04 DIAGNOSIS — R413 Other amnesia: Secondary | ICD-10-CM | POA: Diagnosis not present

## 2024-04-04 DIAGNOSIS — Z23 Encounter for immunization: Secondary | ICD-10-CM | POA: Diagnosis not present

## 2024-04-04 DIAGNOSIS — N3941 Urge incontinence: Secondary | ICD-10-CM | POA: Diagnosis not present

## 2024-04-04 DIAGNOSIS — E559 Vitamin D deficiency, unspecified: Secondary | ICD-10-CM | POA: Diagnosis not present

## 2024-04-04 DIAGNOSIS — E7800 Pure hypercholesterolemia, unspecified: Secondary | ICD-10-CM | POA: Diagnosis not present

## 2024-04-25 ENCOUNTER — Encounter (INDEPENDENT_AMBULATORY_CARE_PROVIDER_SITE_OTHER): Payer: Self-pay | Admitting: Otolaryngology

## 2024-04-25 ENCOUNTER — Ambulatory Visit (INDEPENDENT_AMBULATORY_CARE_PROVIDER_SITE_OTHER): Admitting: Otolaryngology

## 2024-04-25 VITALS — BP 150/80 | HR 80 | Temp 97.6°F | Ht 65.0 in | Wt 174.0 lb

## 2024-04-25 DIAGNOSIS — R42 Dizziness and giddiness: Secondary | ICD-10-CM | POA: Diagnosis not present

## 2024-04-25 DIAGNOSIS — H6123 Impacted cerumen, bilateral: Secondary | ICD-10-CM | POA: Diagnosis not present

## 2024-04-25 DIAGNOSIS — H903 Sensorineural hearing loss, bilateral: Secondary | ICD-10-CM | POA: Diagnosis not present

## 2024-04-25 DIAGNOSIS — H60332 Swimmer's ear, left ear: Secondary | ICD-10-CM | POA: Insufficient documentation

## 2024-04-25 MED ORDER — CIPROFLOXACIN-DEXAMETHASONE 0.3-0.1 % OT SUSP: 4.0000 [drp] | Freq: Two times a day (BID) | OTIC | 8 refills | Status: AC

## 2024-04-25 NOTE — Progress Notes (Signed)
 Follow-up: Recurrent dizziness, right ear Mnire's disease, bilateral hearing loss, recurrent ear infections  HPI: The patient is an 83 year old female who returns today for her follow-up evaluation.  The patient has a history of recurrent ear infections, recurrent dizziness, and bilateral hearing loss.  At her last visit, her right ear Mnire's disease was under control with a low-salt diet.  She was noted to have subjectively stable bilateral sensorineural hearing loss.  According to the patient, she has not had any significant dizziness.  She is still on a low-salt diet.  She is having significant hearing difficulty bilaterally.  She was recently fitted with bilateral new hearing aids.  However, she has lost her right ear hearing aid recently.  Currently she denies any significant otalgia, recent change in her hearing, or vertigo.  Exam: General: Communicates without difficulty, well nourished, no acute distress. Head: Normocephalic, no evidence injury, no tenderness, facial buttresses intact without stepoff. Face/sinus: No tenderness to palpation and percussion. Facial movement is normal and symmetric. Eyes: PERRL, EOMI. No scleral icterus, conjunctivae clear. Neuro: CN II exam reveals vision grossly intact.  No nystagmus at any point of gaze. Ears: Auricles well formed without lesions.  Bilateral cerumen impaction.  Nose: External evaluation reveals normal support and skin without lesions.  Dorsum is intact.  Anterior rhinoscopy reveals congested mucosa over anterior aspect of inferior turbinates and intact septum.  No purulence noted. Oral:  Oral cavity and oropharynx are intact, symmetric, without erythema or edema.  Mucosa is moist without lesions. Neck: Full range of motion without pain.  There is no significant lymphadenopathy.  No masses palpable.  Thyroid  bed within normal limits to palpation.  Parotid glands and submandibular glands equal bilaterally without mass.  Trachea is midline. Neuro:  CN  2-12 grossly intact. Vestibular: No nystagmus at any point of gaze. Vestibular: There is no nystagmus with pneumatic pressure on either tympanic membrane or Valsalva. The cerebellar examination is unremarkable.     Procedure: Bilateral cerumen disimpaction Anesthesia: None Description: Under the operating microscope, the cerumen is carefully removed with a combination of cerumen currette, alligator forceps, and suction catheters.  After the cerumen is removed, purulent drainage is noted within the left ear canal.  The patient tolerated the procedure well.     Assessment: 1.  Recurrent cerumen impaction.  After the disimpaction procedure, purulent drainage is noted within the left ear canal. 2.  Recurrent dizziness, likely secondary to her right ear Mnire's disease.  Her dizziness is currently under control with a low-salt diet. 3.  Subjectively stable bilateral sensorineural hearing loss.  Plan: 1.  Otomicroscopy with bilateral cerumen disimpaction. 2.  The physical exam findings are reviewed with the patient. 3.  Ciprodex eardrops 4 drops left ear twice daily for 1 week. 4.  Continue with a 1500 mg low-salt diet. 5.  Continue the use of her hearing aids. 6.  The patient will return for reevaluation in 4 months.

## 2024-07-05 ENCOUNTER — Ambulatory Visit (INDEPENDENT_AMBULATORY_CARE_PROVIDER_SITE_OTHER): Admitting: Otolaryngology

## 2024-07-05 ENCOUNTER — Encounter (INDEPENDENT_AMBULATORY_CARE_PROVIDER_SITE_OTHER): Payer: Self-pay | Admitting: Otolaryngology

## 2024-07-05 VITALS — HR 78 | Temp 98.1°F | Ht 64.0 in | Wt 174.0 lb

## 2024-07-05 DIAGNOSIS — H6123 Impacted cerumen, bilateral: Secondary | ICD-10-CM

## 2024-07-05 DIAGNOSIS — R42 Dizziness and giddiness: Secondary | ICD-10-CM | POA: Diagnosis not present

## 2024-07-05 DIAGNOSIS — H903 Sensorineural hearing loss, bilateral: Secondary | ICD-10-CM

## 2024-07-05 NOTE — Progress Notes (Signed)
 Follow-up: Recurrent dizziness, right ear Mnire's disease, bilateral hearing loss, recurrent ear infections  HPI: The patient is an 84 year old female who returns today for her follow-up evaluation.  The patient has a history of recurrent ear infections, recurrent dizziness, and bilateral hearing loss.  At her last visit in October 2025, her right ear Mnire's disease was under control with a low-salt diet.  She was noted to have an acute left otitis media.  She was treated with debridement and Ciprodex  eardrops.   According to the patient, she has not had any significant dizziness.  She is still on a low-salt diet.  She is having significant hearing difficulty bilaterally.  She was fitted with bilateral new hearing aids.  Currently she denies any significant otalgia, recent change in her hearing, or vertigo.  Exam: General: Communicates without difficulty, well nourished, no acute distress. Head: Normocephalic, no evidence injury, no tenderness, facial buttresses intact without stepoff. Face/sinus: No tenderness to palpation and percussion. Facial movement is normal and symmetric. Eyes: PERRL, EOMI. No scleral icterus, conjunctivae clear. Neuro: CN II exam reveals vision grossly intact.  No nystagmus at any point of gaze. Ears: Auricles well formed without lesions.  Bilateral cerumen impaction.  Nose: External evaluation reveals normal support and skin without lesions.  Dorsum is intact.  Anterior rhinoscopy reveals congested mucosa over anterior aspect of inferior turbinates and intact septum.  No purulence noted. Oral:  Oral cavity and oropharynx are intact, symmetric, without erythema or edema.  Mucosa is moist without lesions. Neck: Full range of motion without pain.  There is no significant lymphadenopathy.  No masses palpable.  Thyroid  bed within normal limits to palpation.  Parotid glands and submandibular glands equal bilaterally without mass.  Trachea is midline. Neuro:  CN 2-12 grossly intact.  Vestibular: No nystagmus at any point of gaze. Vestibular: There is no nystagmus with pneumatic pressure on either tympanic membrane or Valsalva. The cerebellar examination is unremarkable.     Procedure: Bilateral cerumen disimpaction Anesthesia: None Description: Under the operating microscope, the cerumen is carefully removed with a combination of cerumen currette, alligator forceps, and suction catheters.  After the cerumen is removed, both tympanic membranes are noted to be normal.  The patient tolerated the procedure well.     Assessment: 1.  Recurrent cerumen impaction.  After the disimpaction procedure, both tympanic membranes are noted to be normal. 2.  Recurrent dizziness, likely secondary to her right ear Mnire's disease.  Her dizziness is currently under control with a low-salt diet. 3.  Subjectively stable bilateral sensorineural hearing loss.  Plan: 1.  Otomicroscopy with bilateral cerumen disimpaction. 2.  The physical exam findings are reviewed with the patient. 3. Continue with a 1500 mg low-salt diet. 4.  Continue the use of her hearing aids. 5.  The patient will return for reevaluation in 6 months.

## 2024-08-15 ENCOUNTER — Ambulatory Visit (INDEPENDENT_AMBULATORY_CARE_PROVIDER_SITE_OTHER): Admitting: Otolaryngology

## 2025-01-05 ENCOUNTER — Ambulatory Visit (INDEPENDENT_AMBULATORY_CARE_PROVIDER_SITE_OTHER): Admitting: Otolaryngology
